# Patient Record
Sex: Female | Born: 1970 | Race: White | Hispanic: No | Marital: Married | Smoking: Never smoker
Health system: Southern US, Community
[De-identification: ages and names within clinical notes are randomized; demographics above are authoritative.]

## PROBLEM LIST (undated history)

## (undated) DIAGNOSIS — F32A Depression, unspecified: Secondary | ICD-10-CM

## (undated) DIAGNOSIS — I10 Essential (primary) hypertension: Secondary | ICD-10-CM

## (undated) DIAGNOSIS — F419 Anxiety disorder, unspecified: Secondary | ICD-10-CM

## (undated) DIAGNOSIS — R011 Cardiac murmur, unspecified: Secondary | ICD-10-CM

## (undated) DIAGNOSIS — R112 Nausea with vomiting, unspecified: Secondary | ICD-10-CM

## (undated) HISTORY — PX: EYE SURGERY: SHX253

## (undated) HISTORY — PX: BREAST SURGERY: SHX581

## (undated) HISTORY — PX: BACK SURGERY: SHX140

---

## 2021-02-10 ENCOUNTER — Emergency Department (HOSPITAL_COMMUNITY)
Admission: EM | Admit: 2021-02-10 | Discharge: 2021-02-10 | Disposition: A | Discharge status: Home / Self Care | Payer: BC Managed Care – PPO | Attending: Emergency Medicine | Admitting: Emergency Medicine

## 2021-02-10 ENCOUNTER — Other Ambulatory Visit: Payer: Self-pay

## 2021-02-10 ENCOUNTER — Encounter (HOSPITAL_COMMUNITY): Payer: Self-pay

## 2021-02-10 DIAGNOSIS — R112 Nausea with vomiting, unspecified: Secondary | ICD-10-CM | POA: Diagnosis not present

## 2021-02-10 DIAGNOSIS — I10 Essential (primary) hypertension: Secondary | ICD-10-CM | POA: Insufficient documentation

## 2021-02-10 HISTORY — DX: Essential (primary) hypertension: I10

## 2021-02-10 LAB — CBC WITH DIFFERENTIAL/PLATELET
Abs Immature Granulocytes: 0.02 10*3/uL (ref 0.00–0.07)
Basophils Absolute: 0.1 10*3/uL (ref 0.0–0.1)
Basophils Relative: 1 %
Eosinophils Absolute: 0.1 10*3/uL (ref 0.0–0.5)
Eosinophils Relative: 1 %
HCT: 41.1 % (ref 36.0–46.0)
Hemoglobin: 13 g/dL (ref 12.0–15.0)
Immature Granulocytes: 0 %
Lymphocytes Relative: 22 %
Lymphs Abs: 1.7 10*3/uL (ref 0.7–4.0)
MCH: 32 pg (ref 26.0–34.0)
MCHC: 31.6 g/dL (ref 30.0–36.0)
MCV: 101.2 fL — ABNORMAL HIGH (ref 80.0–100.0)
Monocytes Absolute: 0.6 10*3/uL (ref 0.1–1.0)
Monocytes Relative: 7 %
Neutro Abs: 5.4 10*3/uL (ref 1.7–7.7)
Neutrophils Relative %: 69 %
Platelets: 200 10*3/uL (ref 150–400)
RBC: 4.06 MIL/uL (ref 3.87–5.11)
RDW: 13 % (ref 11.5–15.5)
WBC: 7.9 10*3/uL (ref 4.0–10.5)
nRBC: 0 % (ref 0.0–0.2)

## 2021-02-10 LAB — COMPREHENSIVE METABOLIC PANEL
ALT: 21 U/L (ref 0–44)
AST: 23 U/L (ref 15–41)
Albumin: 4.4 g/dL (ref 3.5–5.0)
Alkaline Phosphatase: 34 U/L — ABNORMAL LOW (ref 38–126)
Anion gap: 10 (ref 5–15)
BUN: 12 mg/dL (ref 6–20)
CO2: 18 mmol/L — ABNORMAL LOW (ref 22–32)
Calcium: 9.3 mg/dL (ref 8.9–10.3)
Chloride: 109 mmol/L (ref 98–111)
Creatinine, Ser: 0.65 mg/dL (ref 0.44–1.00)
GFR, Estimated: >60 mL/min (ref >60)
Glucose, Bld: 110 mg/dL — ABNORMAL HIGH (ref 70–99)
Potassium: 4.2 mmol/L (ref 3.5–5.1)
Sodium: 137 mmol/L (ref 135–145)
Total Bilirubin: 0.7 mg/dL (ref 0.3–1.2)
Total Protein: 7.1 g/dL (ref 6.5–8.1)

## 2021-02-10 LAB — LIPASE, BLOOD: Lipase: 55 U/L — ABNORMAL HIGH (ref 11–51)

## 2021-02-10 MED ORDER — ONDANSETRON HCL 4 MG/2ML IJ SOLN
4.0000 mg | Freq: Once | INTRAMUSCULAR | Status: AC
  Administered 2021-02-10: 4 mg via INTRAVENOUS
  Filled 2021-02-10: qty 2

## 2021-02-10 MED ORDER — SODIUM CHLORIDE 0.9 % IV SOLN: INTRAVENOUS | Status: DC

## 2021-02-10 MED ORDER — SODIUM CHLORIDE 0.9 % IV BOLUS
1000.0000 mL | Freq: Once | INTRAVENOUS | Status: AC
  Administered 2021-02-10: 1000 mL via INTRAVENOUS

## 2021-02-10 MED ORDER — PROMETHAZINE HCL 25 MG PO TABS: 25.0000 mg | ORAL_TABLET | Freq: Four times a day (QID) | ORAL | 0 refills | Status: DC | PRN

## 2021-02-10 MED ORDER — DIPHENHYDRAMINE HCL 50 MG/ML IJ SOLN
25.0000 mg | Freq: Once | INTRAMUSCULAR | Status: AC
  Administered 2021-02-10: 25 mg via INTRAVENOUS
  Filled 2021-02-10: qty 1

## 2021-02-10 NOTE — ED Notes (Signed)
Pt resting has not vomited since being in ER.

## 2021-02-10 NOTE — ED Triage Notes (Signed)
Pt drove to work became nauseous and dizzy.  Began throwing up at work.  Pt has thrown up 2 times since she became sick at an hour ago. Skin warm dry, alert and oriented

## 2021-02-10 NOTE — Discharge Instructions (Signed)
Work-up here today labs without any significant abnormalities.  Vital signs very normal.  Just so you know pregnancy test was not done.  Tums consistent with an acute gastritis most likely viral since the white blood cell count was normal in your blood.  Take the Phenergan as needed for nausea and vomiting.  Return for any new or worse symptoms.

## 2021-02-10 NOTE — ED Provider Notes (Signed)
[facility] EMERGENCY DEPARTMENT Provider Note   CSN: [id number] Arrival date & time: 02/10/21  7425     History Chief Complaint  Patient presents with  . Nausea    Bethany Lowery is a 50 y.o. female.  Patient brought in by EMS.  Patient was on her way to work when she started getting nauseous and dizzy.  When she got to work she threw up 2 times.  She started to get sick about an hour ago.  Denies any abdominal pain no blood in the vomit.  No sick exposures.  No diarrhea.  The dizziness and lightheadedness is now resolved.  Past medical history is significant for hypertension alopecia.        Past Medical History:  Diagnosis Date  . Hypertension     There are no problems to display for this patient.   History reviewed. No pertinent surgical history.   OB History   No obstetric history on file.     No family history on file.  Social History   Tobacco Use  . Smoking status: Never Smoker  . Smokeless tobacco: Never Used  Vaping Use  . Vaping Use: Never used  Substance Use Topics  . Alcohol use: Not Currently  . Drug use: Not Currently    Home Medications Prior to Admission medications   Medication Sig Start Date End Date Taking? Authorizing Provider  promethazine (PHENERGAN) 25 MG tablet Take 1 tablet (25 mg total) by mouth every 6 (six) hours as needed for nausea or vomiting. 02/10/21  Yes Vanetta Mulders, MD    Allergies    Patient has no allergy information on record.  Review of Systems   Review of Systems  Constitutional: Negative for chills and fever.  HENT: Negative for congestion, rhinorrhea and sore throat.   Eyes: Negative for visual disturbance.  Respiratory: Negative for cough and shortness of breath.   Cardiovascular: Negative for chest pain and leg swelling.  Gastrointestinal: Positive for diarrhea and nausea. Negative for abdominal pain and vomiting.  Genitourinary: Negative for dysuria.  Musculoskeletal: Negative for back pain and neck  pain.  Skin: Negative for rash.  Neurological: Positive for dizziness. Negative for light-headedness and headaches.  Hematological: Does not bruise/bleed easily.  Psychiatric/Behavioral: Negative for confusion.    Physical Exam Updated Vital Signs BP 132/80   Pulse 64   Temp (!) 97.1 F (36.2 C) (Oral)   Resp (!) 21   Ht 1.651 m (5\' 5" )   Wt 68 kg   SpO2 100%   BMI 24.96 kg/m   Physical Exam Vitals and nursing note reviewed.  Constitutional:      General: She is not in acute distress.    Appearance: Normal appearance. She is well-developed and well-nourished. She is not ill-appearing or toxic-appearing.  HENT:     Head: Normocephalic and atraumatic.  Eyes:     Extraocular Movements: Extraocular movements intact.     Conjunctiva/sclera: Conjunctivae normal.     Pupils: Pupils are equal, round, and reactive to light.  Cardiovascular:     Rate and Rhythm: Normal rate and regular rhythm.     Heart sounds: No murmur heard.   Pulmonary:     Effort: Pulmonary effort is normal. No respiratory distress.     Breath sounds: Normal breath sounds.  Abdominal:     Palpations: Abdomen is soft.     Tenderness: There is no abdominal tenderness.  Musculoskeletal:        General: No swelling or edema. Normal range  of motion.     Cervical back: Normal range of motion and neck supple.  Skin:    General: Skin is warm and dry.     Capillary Refill: Capillary refill takes less than 2 seconds.  Neurological:     General: No focal deficit present.     Mental Status: She is alert and oriented to person, place, and time.     Cranial Nerves: No cranial nerve deficit.     Sensory: No sensory deficit.     Motor: No weakness.     Coordination: Coordination normal.  Psychiatric:        Mood and Affect: Mood and affect normal.     ED Results / Procedures / Treatments   Labs (all labs ordered are listed, but only abnormal results are displayed) Labs Reviewed  COMPREHENSIVE METABOLIC  PANEL - Abnormal; Notable for the following components:      Result Value   CO2 18 (*)    Glucose, Bld 110 (*)    Alkaline Phosphatase 34 (*)    All other components within normal limits  CBC WITH DIFFERENTIAL/PLATELET - Abnormal; Notable for the following components:   MCV 101.2 (*)    All other components within normal limits  LIPASE, BLOOD - Abnormal; Notable for the following components:   Lipase 55 (*)    All other components within normal limits    EKG None  Radiology No results found.  Procedures Procedures   Medications Ordered in ED Medications  0.9 %  sodium chloride infusion ( Intravenous New Bag/Given 02/10/21 1024)  sodium chloride 0.9 % bolus 1,000 mL (0 mLs Intravenous Stopped 02/10/21 1024)  ondansetron (ZOFRAN) injection 4 mg (4 mg Intravenous Given 02/10/21 0843)  diphenhydrAMINE (BENADRYL) injection 25 mg (25 mg Intravenous Given 02/10/21 0093)    ED Course  I have reviewed the triage vital signs and the nursing notes.  Pertinent labs & imaging results that were available during my care of the patient were reviewed by me and considered in my medical decision making (see chart for details).    MDM Rules/Calculators/A&P                          Patient with acute onset of nausea vomiting 2 times.  Patient received IV fluids here received some Zofran.  Thought maybe she had little bit of allergic reaction developed a rash so she received some Benadryl.  That did not progress.  Is now resolved.  Patient now feeling well.  Received a liter of fluids.  Patient is now urinated 3 times.  So I think she is well hydrated.  No abdominal pain.  Labs did not include pregnancy test.  Lipase was just slightly elevated but this doesn't seem to be consistent with acute pancreatitis.  Her LFTs without significant abnormalities.  No significant leukocytosis.  White blood cell count is normal.  Hemoglobin is normal at 13.  Patient stable for discharge home.  Does not need a  work note.  We'll give her Phenergan if any nausea or vomiting reoccurs.  Patient will return for any new or worse symptoms.  Final Clinical Impression(s) / ED Diagnoses Final diagnoses:  Non-intractable vomiting with nausea, unspecified vomiting type    Rx / DC Orders ED Discharge Orders         Ordered    promethazine (PHENERGAN) 25 MG tablet  Every 6 hours PRN        02/10/21 1254  Vanetta Mulders, MD 02/10/21 1304

## 2021-02-18 ENCOUNTER — Telehealth: Payer: Self-pay

## 2021-02-18 NOTE — Telephone Encounter (Signed)
NOTES ON FILE FROM [facility] [phone], SENT REFERRAL TO SCHEDULING

## 2021-03-14 ENCOUNTER — Encounter (HOSPITAL_COMMUNITY): Payer: Self-pay | Admitting: Emergency Medicine

## 2021-03-14 ENCOUNTER — Emergency Department (HOSPITAL_COMMUNITY)
Admission: EM | Admit: 2021-03-14 | Discharge: 2021-03-14 | Disposition: A | Discharge status: Home / Self Care | Payer: BC Managed Care – PPO | Attending: Emergency Medicine | Admitting: Emergency Medicine

## 2021-03-14 ENCOUNTER — Other Ambulatory Visit: Payer: Self-pay

## 2021-03-14 DIAGNOSIS — M549 Dorsalgia, unspecified: Secondary | ICD-10-CM | POA: Diagnosis present

## 2021-03-14 DIAGNOSIS — Z5321 Procedure and treatment not carried out due to patient leaving prior to being seen by health care provider: Secondary | ICD-10-CM | POA: Diagnosis not present

## 2021-03-14 MED ORDER — OXYCODONE-ACETAMINOPHEN 5-325 MG PO TABS
1.0000 | ORAL_TABLET | Freq: Once | ORAL | Status: AC
Start: 2021-03-14 — End: 2021-03-14
  Administered 2021-03-14: 1 via ORAL
  Filled 2021-03-14: qty 1

## 2021-03-14 NOTE — ED Triage Notes (Signed)
Pt. Stated, Ive had back pain for 4 weeks with no injury

## 2021-03-14 NOTE — ED Notes (Signed)
Patient left on own accord °

## 2021-03-14 NOTE — ED Triage Notes (Signed)
Pt. Has been taken Ibuprofen and Tylenol and has not helped the pain.

## 2021-03-16 ENCOUNTER — Ambulatory Visit: Payer: BC Managed Care – PPO | Admitting: Internal Medicine

## 2021-03-16 ENCOUNTER — Other Ambulatory Visit: Payer: Self-pay

## 2021-03-16 ENCOUNTER — Encounter: Payer: Self-pay | Admitting: Internal Medicine

## 2021-03-16 VITALS — BP 112/74 | HR 73 | Ht 65.0 in | Wt 162.0 lb

## 2021-03-16 DIAGNOSIS — I10 Essential (primary) hypertension: Secondary | ICD-10-CM | POA: Diagnosis not present

## 2021-03-16 DIAGNOSIS — R55 Syncope and collapse: Secondary | ICD-10-CM | POA: Diagnosis not present

## 2021-03-16 DIAGNOSIS — R072 Precordial pain: Secondary | ICD-10-CM | POA: Diagnosis not present

## 2021-03-16 MED ORDER — METOPROLOL TARTRATE 50 MG PO TABS: 50.0000 mg | ORAL_TABLET | Freq: Once | ORAL | 0 refills | Status: AC

## 2021-03-16 NOTE — Patient Instructions (Addendum)
Medication Instructions:  PLEASE TAKE METOPROLOL TARTRATE 50mg  (1) TABLET 2 HOURS PRIOR TO CCTA  *If you need a refill on your cardiac medications before your next appointment, please call your pharmacy*  Lab Work: BMET- 1 WEEK PRIOR TO CCTA  If you have labs (blood work) drawn today and your tests are completely normal, you will receive your results only by: MyChart Message (if you have MyChart) OR . A paper copy in the mail If you have any lab test that is abnormal or we need to change your treatment, we will call you to review the results.  Testing/Procedures: Your physician has requested that you have a renal artery duplex. During this test, an ultrasound is used to evaluate blood flow to the kidneys. Allow one hour for this exam. Do not eat after midnight the day before and avoid carbonated beverages. Take your medications as you usually do.  Your physician has requested that you have cardiac CT. Cardiac computed tomography (CT) is a painless test that uses an x-ray machine to take clear, detailed pictures of your heart. For further information please visit [facility]. Please follow instruction sheet as given.  Follow-Up: At [facility], you and your health needs are our priority.  As part of our continuing mission to provide you with exceptional heart care, we have created designated Provider Care Teams.  These Care Teams include your primary Cardiologist (physician) and Advanced Practice Providers (APPs -  Physician Assistants and Nurse Practitioners) who all work together to provide you with the care you need, when you need it.  We recommend signing up for the patient portal called "MyChart".  Sign up information is provided on this After Visit Summary.  MyChart is used to connect with patients for Virtual Visits (Telemedicine).  Patients are able to view lab/test results, encounter notes, upcoming appointments, etc.  Non-urgent messages can be sent to your provider as well.    To learn more about what you can do with MyChart, go to [facility].    Your next appointment :   AS NEEDED  The format for your next appointment:   In Person  Provider:   [email or web address], MD  Other Instructions Your cardiac CT will be scheduled at one of the below locations:   [facility] [address] [place], [place] [phone]  If scheduled at [facility], please arrive at the [facility] main entrance (entrance A) of [facility] 30 minutes prior to test start time. Proceed to the [facility] Radiology Department (first floor) to check-in and test prep.  Please follow these instructions carefully (unless otherwise directed):  On the Night Before the Test: . Be sure to Drink plenty of water. . Do not consume any caffeinated/decaffeinated beverages or chocolate 12 hours prior to your test. . Do not take any antihistamines 12 hours prior to your test.  On the Day of the Test: . Drink plenty of water until 1 hour prior to the test. . Do not eat any food 4 hours prior to the test. . You may take your regular medications prior to the test.  . Take metoprolol (Lopressor)50mg  (1) TABLET two hours prior to test. . HOLD Furosemide/Hydrochlorothiazide morning of the test. . FEMALES- please wear underwire-free bra if available .  After the Test: . Drink plenty of water. . After receiving IV contrast, you may experience a mild flushed feeling. This is normal. . On occasion, you may experience a mild rash up to 24  hours after the test. This is not dangerous. If this occurs, you can take Benadryl 25 mg and increase your fluid intake. . If you experience trouble breathing, this can be serious. If it is severe call 911 IMMEDIATELY. If it is mild, please call our office. . If you take any of these medications: Glipizide/Metformin, Avandament, Glucavance, please do not take 48 hours after completing test unless otherwise  instructed.  Once we have confirmed authorization from your insurance company, we will call you to set up a date and time for your test. Based on how quickly your insurance processes prior authorizations requests, please allow up to 4 weeks to be contacted for scheduling your Cardiac CT appointment. Be advised that routine Cardiac CT appointments could be scheduled as many as 8 weeks after your provider has ordered it.  For non-scheduling related questions, please contact the cardiac imaging nurse navigator should you have any questions/concerns: Rockwell Alexandria, Cardiac Imaging Nurse Navigator Larey Brick, Cardiac Imaging Nurse Navigator [employer] [facility] Direct Office Dial: [phone]   For scheduling needs, including cancellations and rescheduling, please call [place], [phone].

## 2021-03-16 NOTE — Progress Notes (Signed)
Cardiology Office Note:    Date:  03/16/2021   ID:  Bethany Lowery, DOB 08-Feb-1971, MRN [id number]  PCP:  Patient, No Pcp Per (Inactive)  Cardiologist:  No primary care provider on file.  Electrophysiologist:  None   Referring MD: No ref. provider found   Chief Complaint/Reason for Referral: Presyncope  History of Present Illness:    Bethany Lowery is a 50 y.o. female with a history of migraines, endometriosis, alopecia, anxiety, depression, fibroids.  She presents today for further evaluation of a near syncopal episode that is most consistent with vasovagal presyncope.  She described for her primary doctor, Dr. Mayford Knife in [facility], that she had an episode of profuse sweating with general malaise, felt extremely hot and nauseated, and had significant abdominal cramps.  She had vomiting and diarrhea with lightheadedness and shakiness, and had bradycardia with a rate of 41 and pulse oximeter readings of 76% on room air.  Blood pressure was 150 systolic per report.  Blood sugar was 124.  She was given Zofran and Benadryl and was felt to be stable for discharge.   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  She has been told that she has a heart murmur, but had a very recent echocardiogram performed in the [facility] system which notes normal biventricular function, normal biatrial chamber size, no pericardial effusion, normal aortic root dimension, normal cardiac valves with trace physiologic regurgitation.  IVC not well-visualized.  She notes that when she gave birth to 3rd child had hypotensive episode that was significant and very similar to this current episode.  She also notes a sense of chest discomfort which can happen at rest or with activity, and is not correlated with particular foods or activities.  This only happens on occasion but she is concerned about her heart and wants to make sure that there is no  abnormality that may have contributed to her symptoms.  We discussed that ischemic heart disease is unlikely at her age and would be an unlikely cause of neurocardiogenic symptoms, however her chest pain is not otherwise explained.  Wall motion is described as normal on her echocardiogram, but I cannot independently reviewed these images.  Strong fhx of HTN.  GF COPD.    Past Medical History:  Diagnosis Date  . Hypertension     No past surgical history on file.  Current Medications: Current Meds  Medication Sig  . amLODipine (NORVASC) 10 MG tablet Take 10 mg by mouth daily. 1 Tablet Daily  . gabapentin (NEURONTIN) 300 MG capsule Take 300 mg by mouth at bedtime.  Marland Kitchen HYDROcodone-acetaminophen (HYCET) 7.5-325 mg/15 ml solution Take 10 mLs by mouth 4 (four) times daily as needed for moderate pain.  Marland Kitchen HYDROcodone-acetaminophen (NORCO/VICODIN) 5-325 MG tablet Take 1 tablet by mouth every 6 (six) hours as needed for moderate pain.  . metoprolol tartrate (LOPRESSOR) 50 MG tablet Take 1 tablet (50 mg total) by mouth once for 1 dose. PLEASE TAKE METOPROLOL 2  HOURS PRIOR TO CTA SCAN.  Marland Kitchen venlafaxine (EFFEXOR) 25 MG tablet Take 25 mg by mouth daily. Take 1/2 Half Tablet Daily     Allergies:   Penicillins, Tramadol, and Zofran [ondansetron]   Social History   Tobacco Use  . Smoking status: Never Smoker  . Smokeless tobacco: Never Used  Vaping Use  . Vaping Use: Never used  Substance Use Topics  . Alcohol use: Not Currently  . Drug use: Not Currently  Family History: The patient's family history is not on file.  ROS:   Please see the history of present illness.    All other systems reviewed and are negative.  EKGs/Labs/Other Studies Reviewed:    The following studies were reviewed today:  EKG: Normal sinus rhythm, anterolateral infarct pattern.  Recent Labs: 02/10/2021: ALT 21; BUN 12; Creatinine, Ser 0.65; Hemoglobin 13.0; Platelets 200; Potassium 4.2; Sodium 137  Recent  Lipid Panel No results found for: CHOL, TRIG, HDL, CHOLHDL, VLDL, LDLCALC, LDLDIRECT  Physical Exam:    VS:  BP 112/74   Pulse 73   Ht 5\' 5"  (1.651 m)   Wt 162 lb (73.5 kg)   LMP 03/08/2021   SpO2 94%   BMI 26.96 kg/m     Wt Readings from Last 5 Encounters:  03/16/21 162 lb (73.5 kg)  02/10/21 150 lb (68 kg)    Constitutional: No acute distress Eyes: sclera non-icteric, normal conjunctiva and lids ENMT:, moist mucous membranes Cardiovascular: regular rhythm, normal rate, soft systolic murmur. S1 and S2 normal. Radial pulses normal bilaterally. No jugular venous distention.  Respiratory: clear to auscultation bilaterally GI : normal bowel sounds, soft and nontender. No distention.   MSK: extremities warm, well perfused. No edema.  NEURO: grossly nonfocal exam, moves all extremities. PSYCH: alert and oriented x 3, normal mood and affect.   ASSESSMENT:    1. Hypertension, unspecified type   2. Precordial pain   3. Vasovagal episode    PLAN:    Hypertension, unspecified type - Plan: EKG 12-Lead, Basic metabolic panel, VAS 02/12/21 RENAL ARTERY DUPLEX -She has noted increasing blood pressures requiring increased dose of amlodipine more recently.  We discussed a complete work-up for secondary causes of hypertension.  She is already had an echocardiogram and EKG, we will obtain renal vascular studies to evaluate for renal artery stenosis.  For now she can continue amlodipine at current dose, blood pressure is well controlled today.  Precordial pain - Plan: EKG 12-Lead, CT CORONARY MORPH W/CTA COR W/SCORE W/CA W/CM &/OR WO/CM, CT CORONARY FRACTIONAL FLOW RESERVE DATA PREP, CT CORONARY FRACTIONAL FLOW RESERVE FLUID ANALYSIS, Basic metabolic panel -She is concerned about episodes of chest discomfort, and would like a thorough evaluation.  We discussed the risks and benefits of imaging for ischemic evaluation, and have participated in shared decision making.  To exclude coronary artery disease  in the setting of an autoimmune condition (alopecia), we will obtain a coronary CTA.  Vasovagal episode - Plan: EKG 12-Lead, Basic metabolic panel, VAS US RENAL ARTERY DUPLEX -She likely had a neurocardiogenic episode of presyncope given the classic description.  I have encouraged her to wear compression stockings which she is doing, hydrate adequately, monitor for low blood sugar, and to stay alert to the prodrome of symptoms so that she may find a place to sit or lie down to avoid fainting and having a head or body injury.  She will do so.  [place], MD, [facility]  [facility]    Medication Adjustments/Labs and Tests Ordered: Current medicines are reviewed at length with the patient today.  Concerns regarding medicines are outlined above.   Orders Placed This Encounter  Procedures  . CT CORONARY MORPH W/CTA COR W/SCORE W/CA W/CM &/OR WO/CM  . CT CORONARY FRACTIONAL FLOW RESERVE DATA PREP  . CT CORONARY FRACTIONAL FLOW RESERVE FLUID ANALYSIS  . Basic metabolic panel  . EKG 12-Lead  . VAS [facility] RENAL ARTERY DUPLEX    Meds ordered this  encounter  Medications  . metoprolol tartrate (LOPRESSOR) 50 MG tablet    Sig: Take 1 tablet (50 mg total) by mouth once for 1 dose. PLEASE TAKE METOPROLOL 2  HOURS PRIOR TO CTA SCAN.    Dispense:  1 tablet    Refill:  0    Patient Instructions  Medication Instructions:  PLEASE TAKE METOPROLOL TARTRATE 50mg  (1) TABLET 2 HOURS PRIOR TO CCTA  *If you need a refill on your cardiac medications before your next appointment, please call your pharmacy*  Lab Work: BMET- 1 WEEK PRIOR TO CCTA  If you have labs (blood work) drawn today and your tests are completely normal, you will receive your results only by: MyChart Message (if you have MyChart) OR . A paper copy in the mail If you have any lab test that is abnormal or we need to change your treatment, we will call you to review the results.  Testing/Procedures: Your physician has  requested that you have a renal artery duplex. During this test, an ultrasound is used to evaluate blood flow to the kidneys. Allow one hour for this exam. Do not eat after midnight the day before and avoid carbonated beverages. Take your medications as you usually do.  Your physician has requested that you have cardiac CT. Cardiac computed tomography (CT) is a painless test that uses an x-ray machine to take clear, detailed pictures of your heart. For further information please visit Marland Kitchen. Please follow instruction sheet as given.  Follow-Up: At [facility], you and your health needs are our priority.  As part of our continuing mission to provide you with exceptional heart care, we have created designated Provider Care Teams.  These Care Teams include your primary Cardiologist (physician) and Advanced Practice Providers (APPs -  Physician Assistants and Nurse Practitioners) who all work together to provide you with the care you need, when you need it.  We recommend signing up for the patient portal called "MyChart".  Sign up information is provided on this After Visit Summary.  MyChart is used to connect with patients for Virtual Visits (Telemedicine).  Patients are able to view lab/test results, encounter notes, upcoming appointments, etc.  Non-urgent messages can be sent to your provider as well.   To learn more about what you can do with MyChart, go to [facility].    Your next appointment :   AS NEEDED  The format for your next appointment:   In Person  Provider:   [email or web address], MD  Other Instructions Your cardiac CT will be scheduled at one of the below locations:   [facility] [address] [place], [place] [phone]  If scheduled at [facility], please arrive at the [facility] main entrance (entrance A) of [facility] 30 minutes prior to test start time. Proceed to the [facility] Radiology Department  (first floor) to check-in and test prep.  Please follow these instructions carefully (unless otherwise directed):  On the Night Before the Test: . Be sure to Drink plenty of water. . Do not consume any caffeinated/decaffeinated beverages or chocolate 12 hours prior to your test. . Do not take any antihistamines 12 hours prior to your test.  On the Day of the Test: . Drink plenty of water until 1 hour prior to the test. . Do not eat any food 4 hours prior to the test. . You may take your regular medications prior to the test.  . Take metoprolol (Lopressor)50mg  (1) TABLET  two hours prior to test. . HOLD Furosemide/Hydrochlorothiazide morning of the test. . FEMALES- please wear underwire-free bra if available .  After the Test: . Drink plenty of water. . After receiving IV contrast, you may experience a mild flushed feeling. This is normal. . On occasion, you may experience a mild rash up to 24 hours after the test. This is not dangerous. If this occurs, you can take Benadryl 25 mg and increase your fluid intake. . If you experience trouble breathing, this can be serious. If it is severe call 911 IMMEDIATELY. If it is mild, please call our office. . If you take any of these medications: Glipizide/Metformin, Avandament, Glucavance, please do not take 48 hours after completing test unless otherwise instructed.  Once we have confirmed authorization from your insurance company, we will call you to set up a date and time for your test. Based on how quickly your insurance processes prior authorizations requests, please allow up to 4 weeks to be contacted for scheduling your Cardiac CT appointment. Be advised that routine Cardiac CT appointments could be scheduled as many as 8 weeks after your provider has ordered it.  For non-scheduling related questions, please contact the cardiac imaging nurse navigator should you have any questions/concerns: Rockwell AlexandriaSara Wallace, Cardiac Imaging Nurse Navigator Larey BrickMerle  [place], Cardiac Imaging Nurse Navigator [facility] Direct Office Dial: [phone]   For scheduling needs, including cancellations and rescheduling, please call [place], [phone].

## 2021-04-04 ENCOUNTER — Ambulatory Visit (HOSPITAL_COMMUNITY)
Admission: RE | Admit: 2021-04-04 | Discharge: 2021-04-04 | Disposition: A | Discharge status: Home / Self Care | Payer: BC Managed Care – PPO | Source: Ambulatory Visit | Attending: Cardiovascular Disease | Admitting: Cardiovascular Disease

## 2021-04-04 ENCOUNTER — Other Ambulatory Visit: Payer: Self-pay

## 2021-04-04 DIAGNOSIS — I10 Essential (primary) hypertension: Secondary | ICD-10-CM | POA: Insufficient documentation

## 2021-04-04 DIAGNOSIS — R55 Syncope and collapse: Secondary | ICD-10-CM | POA: Insufficient documentation

## 2021-05-02 ENCOUNTER — Other Ambulatory Visit: Payer: Self-pay | Admitting: Neurosurgery

## 2021-05-02 DIAGNOSIS — I671 Cerebral aneurysm, nonruptured: Secondary | ICD-10-CM

## 2021-05-03 ENCOUNTER — Ambulatory Visit (HOSPITAL_COMMUNITY): Payer: BC Managed Care – PPO

## 2021-05-04 ENCOUNTER — Telehealth: Payer: Self-pay

## 2021-05-04 ENCOUNTER — Telehealth (HOSPITAL_COMMUNITY): Payer: Self-pay | Admitting: Emergency Medicine

## 2021-05-04 NOTE — Telephone Encounter (Signed)
Attempted to call patient in regard to blood work Designer, jewellery) that is needed prior to CT scan tomorrow. Left message for patient to call back to office.

## 2021-05-04 NOTE — Telephone Encounter (Signed)
Spoke with CT- patient does not need labs at this time.

## 2021-05-04 NOTE — Telephone Encounter (Signed)
Reaching out to patient to offer assistance regarding upcoming cardiac imaging study; pt verbalizes understanding of appt date/time, parking situation and where to check in, pre-test NPO status and medications ordered, and verified current allergies; name and call back number provided for further questions should they arise Bahja Bence RN Navigator Cardiac Imaging [facility] [phone] office [phone] cell  50mg metoprolol 2 hr prior to scan Vinh Sachs  

## 2021-05-05 ENCOUNTER — Other Ambulatory Visit: Payer: Self-pay

## 2021-05-05 ENCOUNTER — Ambulatory Visit (HOSPITAL_COMMUNITY)
Admission: RE | Admit: 2021-05-05 | Discharge: 2021-05-05 | Disposition: A | Discharge status: Home / Self Care | Payer: BC Managed Care – PPO | Source: Ambulatory Visit | Attending: Internal Medicine | Admitting: Internal Medicine

## 2021-05-05 DIAGNOSIS — R072 Precordial pain: Secondary | ICD-10-CM | POA: Insufficient documentation

## 2021-05-05 MED ORDER — NITROGLYCERIN 0.4 MG SL SUBL: 0.8000 mg | SUBLINGUAL_TABLET | Freq: Once | SUBLINGUAL | Status: AC

## 2021-05-05 MED ORDER — NITROGLYCERIN 0.4 MG SL SUBL
SUBLINGUAL_TABLET | SUBLINGUAL | Status: AC
  Administered 2021-05-05: 0.8 mg via SUBLINGUAL
  Filled 2021-05-05: qty 2

## 2021-05-05 MED ORDER — IOHEXOL 350 MG/ML SOLN
95.0000 mL | Freq: Once | INTRAVENOUS | Status: AC | PRN
  Administered 2021-05-05: 95 mL via INTRAVENOUS

## 2021-05-24 ENCOUNTER — Ambulatory Visit (HOSPITAL_COMMUNITY)
Admission: RE | Admit: 2021-05-24 | Discharge: 2021-05-24 | Disposition: A | Discharge status: Home / Self Care | Payer: BC Managed Care – PPO | Source: Ambulatory Visit | Attending: Neurosurgery | Admitting: Neurosurgery

## 2021-05-24 ENCOUNTER — Other Ambulatory Visit: Payer: Self-pay

## 2021-05-24 ENCOUNTER — Other Ambulatory Visit: Payer: Self-pay | Admitting: Neurosurgery

## 2021-05-24 DIAGNOSIS — Z880 Allergy status to penicillin: Secondary | ICD-10-CM | POA: Diagnosis not present

## 2021-05-24 DIAGNOSIS — Z888 Allergy status to other drugs, medicaments and biological substances status: Secondary | ICD-10-CM | POA: Insufficient documentation

## 2021-05-24 DIAGNOSIS — I671 Cerebral aneurysm, nonruptured: Secondary | ICD-10-CM | POA: Insufficient documentation

## 2021-05-24 DIAGNOSIS — Z79899 Other long term (current) drug therapy: Secondary | ICD-10-CM | POA: Diagnosis not present

## 2021-05-24 DIAGNOSIS — Z885 Allergy status to narcotic agent status: Secondary | ICD-10-CM | POA: Diagnosis not present

## 2021-05-24 DIAGNOSIS — I10 Essential (primary) hypertension: Secondary | ICD-10-CM | POA: Insufficient documentation

## 2021-05-24 HISTORY — PX: IR ANGIO VERTEBRAL SEL VERTEBRAL BILAT MOD SED: IMG5369

## 2021-05-24 HISTORY — PX: IR ANGIO INTRA EXTRACRAN SEL INTERNAL CAROTID BILAT MOD SED: IMG5363

## 2021-05-24 LAB — CBC WITH DIFFERENTIAL/PLATELET
Abs Immature Granulocytes: 0.02 10*3/uL (ref 0.00–0.07)
Basophils Absolute: 0 10*3/uL (ref 0.0–0.1)
Basophils Relative: 0 %
Eosinophils Absolute: 0.2 10*3/uL (ref 0.0–0.5)
Eosinophils Relative: 3 %
HCT: 39.3 % (ref 36.0–46.0)
Hemoglobin: 13 g/dL (ref 12.0–15.0)
Immature Granulocytes: 0 %
Lymphocytes Relative: 41 %
Lymphs Abs: 2.2 10*3/uL (ref 0.7–4.0)
MCH: 32.3 pg (ref 26.0–34.0)
MCHC: 33.1 g/dL (ref 30.0–36.0)
MCV: 97.5 fL (ref 80.0–100.0)
Monocytes Absolute: 0.5 10*3/uL (ref 0.1–1.0)
Monocytes Relative: 9 %
Neutro Abs: 2.5 10*3/uL (ref 1.7–7.7)
Neutrophils Relative %: 47 %
Platelets: 243 10*3/uL (ref 150–400)
RBC: 4.03 MIL/uL (ref 3.87–5.11)
RDW: 13.1 % (ref 11.5–15.5)
WBC: 5.5 10*3/uL (ref 4.0–10.5)
nRBC: 0 % (ref 0.0–0.2)

## 2021-05-24 LAB — BASIC METABOLIC PANEL
Anion gap: 7 (ref 5–15)
BUN: 11 mg/dL (ref 6–20)
CO2: 25 mmol/L (ref 22–32)
Calcium: 9.4 mg/dL (ref 8.9–10.3)
Chloride: 106 mmol/L (ref 98–111)
Creatinine, Ser: 0.75 mg/dL (ref 0.44–1.00)
GFR, Estimated: >60 mL/min (ref >60)
Glucose, Bld: 98 mg/dL (ref 70–99)
Potassium: 3.8 mmol/L (ref 3.5–5.1)
Sodium: 138 mmol/L (ref 135–145)

## 2021-05-24 LAB — PROTIME-INR
INR: 1 (ref 0.8–1.2)
Prothrombin Time: 13 seconds (ref 11.4–15.2)

## 2021-05-24 MED ORDER — LIDOCAINE HCL (PF) 1 % IJ SOLN
INTRAMUSCULAR | Status: AC
  Filled 2021-05-24: qty 30

## 2021-05-24 MED ORDER — FENTANYL CITRATE (PF) 100 MCG/2ML IJ SOLN
INTRAMUSCULAR | Status: AC
  Filled 2021-05-24: qty 2

## 2021-05-24 MED ORDER — IOHEXOL 300 MG/ML  SOLN
50.0000 mL | Freq: Once | INTRAMUSCULAR | Status: AC | PRN
  Administered 2021-05-24: 40 mL via INTRA_ARTERIAL

## 2021-05-24 MED ORDER — FENTANYL CITRATE (PF) 100 MCG/2ML IJ SOLN
INTRAMUSCULAR | Status: AC | PRN
  Administered 2021-05-24: 25 ug via INTRAVENOUS

## 2021-05-24 MED ORDER — LIDOCAINE HCL 1 % IJ SOLN
INTRAMUSCULAR | Status: AC | PRN
  Administered 2021-05-24: 10 mL

## 2021-05-24 MED ORDER — HEPARIN SODIUM (PORCINE) 1000 UNIT/ML IJ SOLN
INTRAMUSCULAR | Status: AC | PRN
  Administered 2021-05-24: 1000 [IU] via INTRAVENOUS

## 2021-05-24 MED ORDER — IOHEXOL 300 MG/ML  SOLN
50.0000 mL | Freq: Once | INTRAMUSCULAR | Status: AC | PRN
  Administered 2021-05-24: 10 mL via INTRA_ARTERIAL

## 2021-05-24 MED ORDER — HYDROCODONE-ACETAMINOPHEN 5-325 MG PO TABS: 1.0000 | ORAL_TABLET | ORAL | Status: DC | PRN

## 2021-05-24 MED ORDER — SODIUM CHLORIDE 0.9 % IV SOLN: INTRAVENOUS | Status: DC

## 2021-05-24 MED ORDER — IOHEXOL 300 MG/ML  SOLN
100.0000 mL | Freq: Once | INTRAMUSCULAR | Status: AC | PRN
  Administered 2021-05-24: 25 mL via INTRA_ARTERIAL

## 2021-05-24 MED ORDER — MIDAZOLAM HCL 2 MG/2ML IJ SOLN
INTRAMUSCULAR | Status: AC
  Filled 2021-05-24: qty 2

## 2021-05-24 MED ORDER — HEPARIN SODIUM (PORCINE) 1000 UNIT/ML IJ SOLN
INTRAMUSCULAR | Status: AC
  Filled 2021-05-24: qty 1

## 2021-05-24 MED ORDER — MIDAZOLAM HCL 2 MG/2ML IJ SOLN
INTRAMUSCULAR | Status: AC | PRN
  Administered 2021-05-24: 1 mg via INTRAVENOUS

## 2021-05-24 NOTE — Discharge Instructions (Addendum)
Femoral Site Care  This sheet gives you information about how to care for yourself after your procedure. Your health care provider may also give you more specific instructions. If you have problems or questions, contact your health care provider. What can I expect after the procedure? After the procedure, it is common to have:  Bruising that usually fades within 1-2 weeks.  Tenderness at the site. Follow these instructions at home: Wound care  Follow instructions from your health care provider about how to take care of your insertion site. Make sure you: ? Wash your hands with soap and water before you change your bandage (dressing). If soap and water are not available, use hand sanitizer. ? Change your dressing as told by your health care provider. ? Leave stitches (sutures), skin glue, or adhesive strips in place. These skin closures may need to stay in place for 2 weeks or longer. If adhesive strip edges start to loosen and curl up, you may trim the loose edges. Do not remove adhesive strips completely unless your health care provider tells you to do that.  Do not take baths, swim, or use a hot tub until your health care provider approves.  You may shower 24-48 hours after the procedure or as told by your health care provider. ? Gently wash the site with plain soap and water. ? Pat the area dry with a clean towel. ? Do not rub the site. This may cause bleeding.  Do not apply powder or lotion to the site. Keep the site clean and dry.  Check your femoral site every day for signs of infection. Check for: ? Redness, swelling, or pain. ? Fluid or blood. ? Warmth. ? Pus or a bad smell. Activity  For the first 2-3 days after your procedure, or as long as directed: ? Avoid climbing stairs as much as possible. ? Do not squat.  Do not lift anything that is heavier than 10 lb (4.5 kg), or the limit that you are told, until your health care provider says that it is safe.  Rest as  directed. ? Avoid sitting for a long time without moving. Get up to take short walks every 1-2 hours.  Do not drive for 24 hours if you were given a medicine to help you relax (sedative). General instructions  Take over-the-counter and prescription medicines only as told by your health care provider.  Keep all follow-up visits as told by your health care provider. This is important. Contact a health care provider if you have:  A fever or chills.  You have redness, swelling, or pain around your insertion site. Get help right away if:  The catheter insertion area swells very fast.  You pass out.  You suddenly start to sweat or your skin gets clammy.  The catheter insertion area is bleeding, and the bleeding does not stop when you hold steady pressure on the area.  The area near or just beyond the catheter insertion site becomes pale, cool, tingly, or numb. These symptoms may represent a serious problem that is an emergency. Do not wait to see if the symptoms will go away. Get medical help right away. Call your local emergency services (911 in the [place] Do not drive yourself to the hospital. Summary  After the procedure, it is common to have bruising that usually fades within 1-2 weeks.  Check your femoral site every day for signs of infection.  Do not lift anything that is heavier than 10 lb (4.5 kg), or   the limit that you are told, until your health care provider says that it is safe. This information is not intended to replace advice given to you by your health care provider. Make sure you discuss any questions you have with your health care provider. Document Revised: 08/06/2020 Document Reviewed: 08/06/2020 Elsevier Patient Education  2021 Elsevier Inc.  Cerebral Angiogram  A cerebral angiogram is a procedure that is used to examine the blood vessels in the brain and neck. Contrast dye is injected through a thin tube (catheter) into an artery. X-ray pictures are then taken.  The pictures can show an abnormality in a blood vessel, such as a blockage, narrowing (stenosis), or bulging (aneurysm). Tell a health care provider about:  Any allergies you have, including allergies to medicines, shellfish, contrast dye, or iodine.  All medicines you are taking, including vitamins, herbs, eye drops, creams, and over-the-counter medicines.  Any blood thinning medicines you take, such as aspirin or warfarin.  Any problems you or family members have had with anesthetic medicines.  Any blood disorders you have.  Any surgeries you have had.  Any medical conditions you have or have had, including kidney problems or kidney failure.  Whether you are pregnant or may be pregnant, or are breastfeeding. What are the risks? Generally, this is a safe procedure. However, problems may occur, including:  Problems in the insertion site, such as bleeding, bruising, infection, or collection of blood under the skin (hematoma).  Allergic reaction to medicines or dyes.  Damage to nearby structures or organs, including blood vessels or arteries. Also, contrast dye can damage the kidneys.  Blood clot.  Weakness, numbness, speech, or vision problems. This is usually temporary.  Stroke.  Amnesia, or being unable to remember what happened (rare). This is temporary. What happens before the procedure? Staying hydrated Follow instructions from your healthcare provider about hydration, which may include:  Up to 2 hours before the procedure - you may continue to drink clear liquids, such as water, clear fruit juice, black coffee, and plain tea. Eating and drinking restrictions Follow instructions from your health care provider about eating and drinking, which may include:  8 hours before the procedure - stop eating heavy meals or foods, such as meat, fried foods, or fatty foods.  6 hours before the procedure - stop eating light meals or foods, such as toast or cereal.  6 hours before  the procedure - stop drinking milk or drinks that contain milk.  2 hours before the procedure - stop drinking clear liquids. Medicines Ask your health care provider about:  Changing or stopping your regular medicines. This is especially important if you are taking diabetes medicines or blood thinners.  Taking medicines such as aspirin and ibuprofen. These medicines can thin your blood. Do not take these medicines unless your health care provider tells you to take them.  Taking over-the-counter medicines, vitamins, herbs, and supplements. General instructions  Do not use any products that contain nicotine or tobacco for at least 4 weeks before the procedure. These products include cigarettes, e-cigarettes, and chewing tobacco. If you need help quitting, ask your health care provider.  You may have blood tests done.  Plan to have someone take you home from the hospital or clinic.  If you will be going home the same day of the procedure, plan to have someone with you for 24 hours.  Ask your health care provider what steps will be taken to prevent infection. These may include: ? Removing hair at  the insertion site. ? Washing skin with a germ-killing soap. ? Taking antibiotic medicine. What happens during the procedure?  You will lie on an X-ray table. Your head and legs may be strapped down.  An IV will be inserted into one of your veins.  You will be given one or both of the following: ? A medicine to help you relax (sedative). ? A medicine to numb the area (local anesthetic) where the catheter will be inserted, usually in your groin, leg, or arm.  Your heart rate and other vital signs will be watched carefully. Electrodes may be placed on your chest.  A small incision will be made. The catheter will be moved through the incision up to the blood vessels in your neck and brain.  Dye will be injected into the catheter and will travel to the blood vessels of the brain and neck. You  may notice a warm feeling or strange taste in your mouth.  You will be asked to lie still. X-rays will be taken to show the flow of the dye through the blood vessels in the brain and neck.  If an abnormality is found in a blood vessel, another procedure may be done to treat the problem.  Tell your health care provider if you develop chest pain or trouble breathing during the procedure.  When the images are finished, the catheter will be removed. Pressure will be applied to stop bleeding.  A closure device may be placed into the access site to form a seal. The seal stops bleeding and helps the artery heal.  A bandage (dressing)will be applied over the small opening in the skin.  Your IV will be removed. The procedure may vary among health care providers and hospitals. What happens after the procedure?  Your blood pressure, heart rate, breathing rate, and blood oxygen level will be monitored until you leave the hospital or clinic.  You will be asked to lie flat for several hours. You will keep the limb where the catheter was inserted straight.  The insertion site and the pulse in your foot or wrist will be checked often.  You will be told to drink plenty of fluids. This will help flush the contrast dye out of your system.  Do not drive for 24 hours if you were given a sedative during your procedure.  It is up to you to get the results of your procedure. Ask your health care provider, or the department that is doing the procedure, when your results will be ready. Summary  A cerebral angiogram is a procedure that checks the health of the blood vessels in the brain and neck.  You will be given a sedativeand a local anesthetic. You may feel pressure when the catheter is inserted and warmth when the dye is injected.  Contrast dye is injected through a catheter into an artery. X-rays are taken to look for an abnormality, such as blockage or narrowing.  After the procedure, you will be  asked to lie flat for several hours. Do not drive for 24 hours, or until a health care provider tells you to. This information is not intended to replace advice given to you by your health care provider. Make sure you discuss any questions you have with your health care provider. Document Revised: 06/24/2019 Document Reviewed: 06/24/2019 Elsevier Patient Education  2021 Elsevier Inc. Moderate Conscious Sedation, Adult Sedation is the use of medicines to promote relaxation and to relieve discomfort and anxiety. Moderate conscious sedation is a  type of sedation. Under moderate conscious sedation, you are less alert than normal, but you are still able to respond to instructions, touch, or both. Moderate conscious sedation is used during short medical and dental procedures. It is milder than deep sedation, which is a type of sedation under which you cannot be easily woken up. It is also milder than general anesthesia, which is the use of medicines to make you unconscious. Moderate conscious sedation allows you to return to your regular activities sooner. Tell a health care provider about:  Any allergies you have.  All medicines you are taking, including vitamins, herbs, eye drops, creams, and over-the-counter medicines.  Any use of steroids. This includes steroids taken by mouth or as a cream.  Any problems you or family members have had with sedatives and anesthetic medicines.  Any blood disorders you have.  Any surgeries you have had.  Any medical conditions you have, such as sleep apnea.  Whether you are pregnant or may be pregnant.  Any use of cigarettes, alcohol, marijuana, or drugs. What are the risks? Generally, this is a safe procedure. However, problems may occur, including:  Getting too much medicine (oversedation).  Nausea.  Allergic reaction to medicines.  Trouble breathing. If this happens, a breathing tube may be used. It will be removed when you are awake and breathing  on your own.  Heart trouble.  Lung trouble.  Confusion that gets better with time (emergence delirium). What happens before the procedure? Staying hydrated Follow instructions from your health care provider about hydration, which may include:  Up to 2 hours before the procedure - you may continue to drink clear liquids, such as water, clear fruit juice, black coffee, and plain tea. Eating and drinking restrictions Follow instructions from your health care provider about eating and drinking, which may include:  8 hours before the procedure - stop eating heavy meals or foods, such as meat, fried foods, or fatty foods.  6 hours before the procedure - stop eating light meals or foods, such as toast or cereal.  6 hours before the procedure - stop drinking milk or drinks that contain milk.  2 hours before the procedure - stop drinking clear liquids. Medicines Ask your health care provider about:  Changing or stopping your regular medicines. This is especially important if you are taking diabetes medicines or blood thinners.  Taking medicines such as aspirin and ibuprofen. These medicines can thin your blood. Do not take these medicines unless your health care provider tells you to take them.  Taking over-the-counter medicines, vitamins, herbs, and supplements. Tests and exams  You will have a physical exam.  You may have blood tests done to show how well: ? Your kidneys and liver work. ? Your blood clots. General instructions  Plan to have a responsible adult take you home from the hospital or clinic.  If you will be going home right after the procedure, plan to have a responsible adult care for you for the time you are told. This is important. What happens during the procedure?  You will be given the sedative. The sedative may be given: ? As a pill that you will swallow. It can also be inserted into the rectum. ? As a spray through the nose. ? As an injection into the  muscle. ? As an injection into the vein through an IV.  You may be given oxygen as needed.  Your breathing, heart rate, and blood pressure will be monitored during the procedure.  The medical or dental procedure will be done. The procedure may vary among health care providers and hospitals.   What happens after the procedure?  Your blood pressure, heart rate, breathing rate, and blood oxygen level will be monitored until you leave the hospital or clinic.  You will get fluids through your IV if needed.  Do not drive or operate machinery until your health care provider says that it is safe. Summary  Sedation is the use of medicines to promote relaxation and to relieve discomfort and anxiety. Moderate conscious sedation is a type of sedation that is used during short medical and dental procedures.  Tell the health care provider about any medical conditions that you have and about all the medicines that you are taking.  You will be given the sedative as a pill, a spray through the nose, an injection into the muscle, or an injection into the vein through an IV. Vital signs are monitored during the sedation.  Moderate conscious sedation allows you to return to your regular activities sooner. This information is not intended to replace advice given to you by your health care provider. Make sure you discuss any questions you have with your health care provider. Document Revised: 04/02/2020 Document Reviewed: 10/30/2019 Elsevier Patient Education  2021 [employer].

## 2021-05-24 NOTE — H&P (Signed)
  Chief Complaint   Aneurysm  History of Present Illness  Bethany Lowery is a 50 y.o. female with history of difficult to control hypertension and vasovagal episodes.  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  Patient was seen in the outpatient neurosurgery clinic and elected to proceed with further work-up with diagnostic cerebral angiogram.  Past Medical History   Past Medical History:  Diagnosis Date  . Hypertension     Past Surgical History  No past surgical history on file.  Social History   Social History   Tobacco Use  . Smoking status: Never Smoker  . Smokeless tobacco: Never Used  Vaping Use  . Vaping Use: Never used  Substance Use Topics  . Alcohol use: Not Currently  . Drug use: Not Currently    Medications   Prior to Admission medications   Medication Sig Start Date End Date Taking? Authorizing Provider  amLODipine (NORVASC) 10 MG tablet Take 10 mg by mouth daily. 1 Tablet Daily   Yes [provider]  gabapentin (NEURONTIN) 300 MG capsule Take 300 mg by mouth at bedtime. 12/03/20  Yes [provider]  HYDROcodone-acetaminophen (HYCET) 7.5-325 mg/15 ml solution Take 10 mLs by mouth 4 (four) times daily as needed for moderate pain.   Yes [provider]  HYDROcodone-acetaminophen (NORCO/VICODIN) 5-325 MG tablet Take 1 tablet by mouth every 6 (six) hours as needed for moderate pain.   Yes [provider]  venlafaxine (EFFEXOR) 25 MG tablet Take 25 mg by mouth daily. Take 1/2 Half Tablet Daily   Yes [provider]  metoprolol tartrate (LOPRESSOR) 50 MG tablet Take 1 tablet (50 mg total) by mouth once for 1 dose. PLEASE TAKE METOPROLOL 2  HOURS PRIOR TO CTA SCAN. 03/16/21 03/16/21  Parke Poisson, MD    Allergies   Allergies  Allergen Reactions  . Penicillins Hives  . Tramadol     Other reaction(s): Hives  . Zofran [Ondansetron] Hives     Review of Systems  ROS  Neurologic Exam  Awake, alert, oriented Memory and concentration grossly intact Speech fluent, appropriate CN grossly intact Motor exam: Upper Extremities Deltoid Bicep Tricep Grip  Right 5/5 5/5 5/5 5/5  Left 5/5 5/5 5/5 5/5   Lower Extremities IP Quad PF DF EHL  Right 5/5 5/5 5/5 5/5 5/5  Left 5/5 5/5 5/5 5/5 5/5   Sensation grossly intact to LT  Imaging  MRI of the brain dated 03/19/2021 was reviewed and demonstrates a small aneurysm arising from the right A1 A2 junction.  There is triplicate A2.  Impression  - 50 y.o. 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  We have discussed the expected postoperative course and recovery.  All the patient's questions today were answered and she provided informed consent to proceed.  [name], MD [facility]

## 2021-05-24 NOTE — Brief Op Note (Signed)
  NEUROSURGERY BRIEF OPERATIVE  NOTE   PREOP DX: Cerebral aneurysm  POSTOP DX: Same  PROCEDURE: Diagnostic cerebral angiogram  SURGEON: Dr. [name], MD  ANESTHESIA: IV Sedation with Local  EBL: Minimal  SPECIMENS: None  COMPLICATIONS: None  CONDITION: Stable to recovery  FINDINGS (Full report in [facility]): 1. 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

## 2021-05-30 ENCOUNTER — Encounter (HOSPITAL_COMMUNITY): Payer: Self-pay

## 2021-06-06 ENCOUNTER — Other Ambulatory Visit: Payer: Self-pay | Admitting: Neurosurgery

## 2021-06-07 ENCOUNTER — Other Ambulatory Visit: Payer: Self-pay | Admitting: Neurosurgery

## 2021-06-24 NOTE — Progress Notes (Signed)
Surgical Instructions    Your procedure is scheduled on Thursday, July 14th, 2022.  Report to [facility] Main Entrance "A" at 10:30 A.M., then check in with the Admitting office.  Call this number if you have problems the morning of surgery:  [phone]   If you have any questions prior to your surgery date call [phone]: Open Monday-Friday 8am-4pm    Remember:  Do not eat or drink after midnight the night before your surgery     Take these medicines the morning of surgery with A SIP OF WATER:  amLODipine (NORVASC)  venlafaxine [facility])   If needed:  cetirizine (ZYRTEC) HYDROcodone-acetaminophen (NORCO/VICODIN)  As of today, STOP taking any Aspirin (unless otherwise instructed by your surgeon) Aleve, Naproxen, Ibuprofen, Motrin, Advil, Goody's, BC's, all herbal medications, fish oil, and all vitamins.          Do not wear jewelry or makeup Do not wear lotions, powders, perfumes, or deodorant. Do not shave 48 hours prior to surgery.   Do not bring valuables to the hospital. DO Not wear nail polish, gel polish, artificial nails, or any other type of covering on natural nails including finger and toenails. If patients have artificial nails, gel coating, etc. that need to be removed by a nail salon please have this removed prior to surgery or surgery may need to be canceled/delayed if the surgeon/ anesthesia feels like the patient is unable to be adequately monitored.             [facility] is not responsible for any belongings or valuables.  Do NOT Smoke (Tobacco/Vaping) or drink Alcohol 24 hours prior to your procedure If you use a CPAP at night, you may bring all equipment for your overnight stay.   Contacts, glasses, dentures or bridgework may not be worn into surgery, please bring cases for these belongings   For patients admitted to the hospital, discharge time will be determined by your treatment team.   Patients discharged the day of surgery will not be allowed  to drive home, and someone needs to stay with them for 24 hours.  ONLY 1 SUPPORT PERSON MAY BE PRESENT WHILE YOU ARE IN SURGERY. IF YOU ARE TO BE ADMITTED ONCE YOU ARE IN YOUR ROOM YOU WILL BE ALLOWED TWO (2) VISITORS.  Minor children may have two parents present. Special consideration for safety and communication needs will be reviewed on a case by case basis.  Special instructions:    Oral Hygiene is also important to reduce your risk of infection.  Remember - BRUSH YOUR TEETH THE MORNING OF SURGERY WITH YOUR REGULAR TOOTHPASTE   [facility]- Preparing For Surgery  Before surgery, you can play an important role. Because skin is not sterile, your skin needs to be as free of germs as possible. You can reduce the number of germs on your skin by washing with CHG (chlorahexidine gluconate) Soap before surgery.  CHG is an antiseptic cleaner which kills germs and bonds with the skin to continue killing germs even after washing.     Please do not use if you have an allergy to CHG or antibacterial soaps. If your skin becomes reddened/irritated stop using the CHG.  Do not shave (including legs and underarms) for at least 48 hours prior to first CHG shower. It is OK to shave your face.  Please follow these instructions carefully.     Shower the NIGHT BEFORE SURGERY and the MORNING OF SURGERY with CHG Soap.   If you chose  to wash your hair, wash your hair first as usual with your normal shampoo. After you shampoo, rinse your hair and body thoroughly to remove the shampoo.  Then [employer] and genitals (private parts) with your normal soap and rinse thoroughly to remove soap.  After that Use CHG Soap as you would any other liquid soap. You can apply CHG directly to the skin and wash gently with a scrungie or a clean washcloth.   Apply the CHG Soap to your body ONLY FROM THE NECK DOWN.  Do not use on open wounds or open sores. Avoid contact with your eyes, ears, mouth and genitals (private parts). Wash  Face and genitals (private parts)  with your normal soap.   Wash thoroughly, paying special attention to the area where your surgery will be performed.  Thoroughly rinse your body with warm water from the neck down.  DO NOT shower/wash with your normal soap after using and rinsing off the CHG Soap.  Pat yourself dry with a CLEAN TOWEL.  Wear CLEAN PAJAMAS to bed the night before surgery  Place CLEAN SHEETS on your bed the night before your surgery  DO NOT SLEEP WITH PETS.   Day of Surgery:  Take a shower with CHG soap. Wear Clean/Comfortable clothing the morning of surgery Do not apply any deodorants/lotions.   Remember to brush your teeth WITH YOUR REGULAR TOOTHPASTE.   Please read over the following fact sheets that you were given.

## 2021-06-27 ENCOUNTER — Encounter (HOSPITAL_COMMUNITY): Payer: Self-pay

## 2021-06-27 ENCOUNTER — Other Ambulatory Visit: Payer: Self-pay

## 2021-06-27 ENCOUNTER — Encounter (HOSPITAL_COMMUNITY)
Admission: RE | Admit: 2021-06-27 | Discharge: 2021-06-27 | Disposition: A | Discharge status: Home / Self Care | Payer: BC Managed Care – PPO | Source: Ambulatory Visit | Attending: Neurosurgery | Admitting: Neurosurgery

## 2021-06-27 DIAGNOSIS — Z20822 Contact with and (suspected) exposure to covid-19: Secondary | ICD-10-CM | POA: Insufficient documentation

## 2021-06-27 DIAGNOSIS — Z01812 Encounter for preprocedural laboratory examination: Secondary | ICD-10-CM | POA: Insufficient documentation

## 2021-06-27 HISTORY — DX: Other specified postprocedural states: R11.2

## 2021-06-27 HISTORY — DX: Anxiety disorder, unspecified: F41.9

## 2021-06-27 HISTORY — DX: Cardiac murmur, unspecified: R01.1

## 2021-06-27 HISTORY — DX: Depression, unspecified: F32.A

## 2021-06-27 LAB — URINALYSIS, ROUTINE W REFLEX MICROSCOPIC
Bilirubin Urine: NEGATIVE
Glucose, UA: NEGATIVE mg/dL
Hgb urine dipstick: NEGATIVE
Ketones, ur: NEGATIVE mg/dL
Leukocytes,Ua: NEGATIVE
Nitrite: NEGATIVE
Protein, ur: NEGATIVE mg/dL
Specific Gravity, Urine: 1.006 (ref 1.005–1.030)
pH: 5 (ref 5.0–8.0)

## 2021-06-27 LAB — BASIC METABOLIC PANEL
Anion gap: 8 (ref 5–15)
BUN: 7 mg/dL (ref 6–20)
CO2: 24 mmol/L (ref 22–32)
Calcium: 9.4 mg/dL (ref 8.9–10.3)
Chloride: 104 mmol/L (ref 98–111)
Creatinine, Ser: 0.63 mg/dL (ref 0.44–1.00)
GFR, Estimated: >60 mL/min (ref >60)
Glucose, Bld: 90 mg/dL (ref 70–99)
Potassium: 3.9 mmol/L (ref 3.5–5.1)
Sodium: 136 mmol/L (ref 135–145)

## 2021-06-27 LAB — CBC
HCT: 43.2 % (ref 36.0–46.0)
Hemoglobin: 14.1 g/dL (ref 12.0–15.0)
MCH: 32.9 pg (ref 26.0–34.0)
MCHC: 32.6 g/dL (ref 30.0–36.0)
MCV: 100.9 fL — ABNORMAL HIGH (ref 80.0–100.0)
Platelets: 251 10*3/uL (ref 150–400)
RBC: 4.28 MIL/uL (ref 3.87–5.11)
RDW: 13.6 % (ref 11.5–15.5)
WBC: 8.5 10*3/uL (ref 4.0–10.5)
nRBC: 0 % (ref 0.0–0.2)

## 2021-06-27 LAB — APTT: aPTT: 26 seconds (ref 24–36)

## 2021-06-27 NOTE — Progress Notes (Signed)
PCP - Melene Muller, MD [facility]) Cardiologist - Weston Brass, MD  PPM/ICD - denies Device Orders - N/A Rep Notified - N/A  Chest x-ray - N/A EKG - 03/16/2021 Stress Test - denies ECHO - 02/28/2021 Cardiac Cath - denies  Sleep Study - denies CPAP - N/A  Fasting Blood Sugar - N/A  Blood Thinner Instructions: N/A  Aspirin Instructions: Patient was instructed: As of today, STOP taking any Aspirin (unless otherwise instructed by your surgeon) Aleve, Naproxen, Ibuprofen, Motrin, Advil, Goody's, BC's, all herbal medications, fish oil, and all vitamins.  ERAS Protcol - no PRE-SURGERY Ensure or G2- N/A  COVID TEST- 06/27/2021   Anesthesia review: Yes; anesthesia complication - nausea/vomiting  Patient denies shortness of breath, fever, cough and chest pain at PAT appointment   All instructions explained to the patient, with a verbal understanding of the material. Patient agrees to go over the instructions while at home for a better understanding. Patient also instructed to self quarantine after being tested for COVID-19. The opportunity to ask questions was provided.

## 2021-06-28 LAB — SARS CORONAVIRUS 2 (TAT 6-24 HRS): SARS Coronavirus 2: NEGATIVE

## 2021-06-28 NOTE — Progress Notes (Signed)
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  She has aight A1 A2 junction aneurysm.  Other history includes never smoker, postoperative N/V, HTN, murmur (no significant valvular disease 02/28/21 echo), anxiety, depression, back surgery.   She had cardiology evaluation by Dr. Jacques Navy 03/16/2021 for presyncope.  Episode felt most consistent with vasovagal presyncope.  Echocardiogram in [facility] showed normal biventricular function, normal biatrial chamber size, no pericardial effusion, normal aortic root dimension, normal cardiac valves with trace physiologic regurgitation.  She also reported some precordial pain, so CCTA ordered which showed no CAD. As needed follow-up.   06/27/2021 presurgical COVID-19 test negative.  Anesthesia team to evaluate on the day of surgery.   VS: BP (!) 132/91   Pulse 70   Temp 36.6 C (Oral)   Resp 18   Ht 5\' 5"  (1.651 m)   Wt 68.3 kg   LMP 06/25/2021   SpO2 100%   BMI 25.04 kg/m   PROVIDERS: PCP - 08/26/2021, MD (in [place], [place]) Cardiologist - [place], MD. As needed follow-up per 03/16/21 visit.   LABS: Labs reviewed: Acceptable for surgery.  She is for urine pregnancy test on the day of surgery. (all labs ordered are listed, but only abnormal results are displayed)  Labs Reviewed  URINALYSIS, ROUTINE W REFLEX MICROSCOPIC - Abnormal; Notable for the following components:      Result Value   Color, Urine STRAW (*)    All other components within normal limits  CBC - Abnormal; Notable for the following components:   MCV 100.9 (*)    All other components within normal limits  SARS CORONAVIRUS 2 (TAT 6-24 HRS)   APTT  BASIC METABOLIC PANEL  TYPE AND SCREEN     IMAGES: Cerebral Angiogram 05/24/21: IMPRESSION:  1. 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   MRI Head and MRI Head 03/21/21 ([facility]): Impression: Normal MRI.  3 mm right side anterior communicating artery aneurysm on MRA.  MRI L-spine 03/21/21 ([facility]): IMPRESSION:  Severe facet arthropathy at L4-5. There is a synovial cyst projecting medially from the left facet joint with mass effect on the left L5 nerve root.    Thyroid 05/21/21 02/28/21 ([facility]): IMPRESSION:  Multiple solid nodules, TI-RADS category 4. The largest of these  measures 1.3 cm. Recommend follow-up per TI-RADS criteria as below.   TIRADS 1: no FNA required.  TIRADS 2: no FNA required.  TIRADS 3: Greater than or equal to 2.5 cm FNA, Greater than or equal to 1.5 cm follow up in 1, 3 and 5 years.  TIRADS 4: Greater than or equal to 1.5 cm FNA, Greater than or equal to 1.0 cm follow up in 1, 2, 3 and 5 years.  TIRADS 5: Greater than or equal to 1.0 cm FNA, Greater than or equal to 0.5 cm follow up annually for up to 5 years.     EKG: 03/16/2021: Normal sinus rhythm.  Anterolateral infarct, age undetermined.   CV: CT Coronary 05/05/21: IMPRESSION: 1. Coronary calcium score of 0. This was 0 percentile for age  and sex matched control. 2. Normal coronary origin with right dominance. 3. No evidence of CAD.    Renal artery [place] 04/04/21: Summary:  Renal:  Right: No evidence of right renal artery stenosis. RRV flow present.         Normal size right kidney. Normal right Resisitive Index.         Normal cortical thickness of right kidney.  Left:  No evidence of left renal artery stenosis. LRV flow present.         Normal size of left kidney. Normal left Resistive Index.         Normal cortical thickness of the left kidney.  Mesenteric:  Normal Celiac artery and  Superior Mesenteric artery findings.      Echo 02/28/21 ([facility]): Impression: 1. LV ejection fraction is estimated to be 55 to 60%.                           2. The right ventricle is normal in size.    -Trace mitral valve regurgitation.  Trivial tricuspid valve regurgitation.  Trace pulmonic valve regurgitation.                                                                                                               Past Medical History:  Diagnosis Date   Anxiety    Depression    Heart murmur    Hypertension    PONV (postoperative nausea and vomiting)     Past Surgical History:  Procedure Laterality Date   BACK SURGERY     BREAST SURGERY     EYE SURGERY     IR ANGIO INTRA EXTRACRAN SEL INTERNAL CAROTID BILAT MOD SED  05/24/2021   IR ANGIO VERTEBRAL SEL VERTEBRAL BILAT MOD SED  05/24/2021    MEDICATIONS:  amLODipine (NORVASC) 10 MG tablet   BIOTIN PO   cetirizine (ZYRTEC) 10 MG tablet   cholecalciferol (VITAMIN D3) 25 MCG (1000 UNIT) tablet   HYDROcodone-acetaminophen (NORCO/VICODIN) 5-325 MG tablet   metoprolol tartrate (LOPRESSOR) 50 MG tablet   Multiple Vitamin (MULTIVITAMIN WITH MINERALS) TABS tablet   Omega-3 Fatty Acids (FISH OIL PO)   venlafaxine (EFFEXOR) 25 MG tablet   vitamin E 180 MG (400 UNITS) capsule   No current facility-administered medications for this encounter.    Shonna Chock, PA-C Surgical Short Stay/Anesthesiology [facility] Phone [phone] [facility] Phone [phone] 06/28/2021 8:12 PM

## 2021-06-28 NOTE — Anesthesia Preprocedure Evaluation (Addendum)
Anesthesia Evaluation  Patient identified by MRN, date of birth, ID band Patient awake    Reviewed: Allergy & Precautions, NPO status , Patient's Chart, lab work & pertinent test results  History of Anesthesia Complications (+) PONV and history of anesthetic complications  Airway Mallampati: I  TM Distance: >3 FB Neck ROM: Full    Dental  (+) Teeth Intact, Dental Advisory Given   Pulmonary    breath sounds clear to auscultation       Cardiovascular hypertension, Pt. on medications  Rhythm:Regular Rate:Normal     Neuro/Psych PSYCHIATRIC DISORDERS Anxiety Depression    GI/Hepatic negative GI ROS, Neg liver ROS,   Endo/Other  negative endocrine ROS  Renal/GU negative Renal ROS     Musculoskeletal negative musculoskeletal ROS (+)   Abdominal Normal abdominal exam  (+)   Peds  Hematology negative hematology ROS (+)   Anesthesia Other Findings   Reproductive/Obstetrics                           Anesthesia Physical Anesthesia Plan  ASA: 3  Anesthesia Plan: General   Post-op Pain Management:    Induction: Intravenous  PONV Risk Score and Plan: Ondansetron, Dexamethasone, Midazolam and Treatment may vary due to age or medical condition  Airway Management Planned: Oral ETT  Additional Equipment: Arterial line, CVP and Ultrasound Guidance Line Placement  Intra-op Plan:   Post-operative Plan: Extubation in OR  Informed Consent: I have reviewed the patients History and Physical, chart, labs and discussed the procedure including the risks, benefits and alternatives for the proposed anesthesia with the patient or authorized representative who has indicated his/her understanding and acceptance.     Dental advisory given  Plan Discussed with: CRNA  Anesthesia Plan Comments: (PAT note written 06/28/2021 by Shonna Chock, PA-C. )      Anesthesia Quick Evaluation

## 2021-06-30 ENCOUNTER — Other Ambulatory Visit: Payer: Self-pay

## 2021-06-30 ENCOUNTER — Inpatient Hospital Stay (HOSPITAL_COMMUNITY): Payer: BC Managed Care – PPO | Admitting: Certified Registered"

## 2021-06-30 ENCOUNTER — Inpatient Hospital Stay (HOSPITAL_COMMUNITY): Payer: BC Managed Care – PPO | Admitting: Vascular Surgery

## 2021-06-30 ENCOUNTER — Inpatient Hospital Stay (HOSPITAL_COMMUNITY): Payer: BC Managed Care – PPO

## 2021-06-30 ENCOUNTER — Encounter (HOSPITAL_COMMUNITY): Payer: Self-pay | Admitting: Neurosurgery

## 2021-06-30 ENCOUNTER — Inpatient Hospital Stay (HOSPITAL_COMMUNITY)
Admission: RE | Admit: 2021-06-30 | Discharge: 2021-07-05 | DRG: 027 | Disposition: A | Discharge status: Home / Self Care | Payer: BC Managed Care – PPO | Attending: Neurosurgery | Admitting: Neurosurgery

## 2021-06-30 ENCOUNTER — Encounter (HOSPITAL_COMMUNITY)
Admission: RE | Disposition: A | Discharge status: Home / Self Care | Payer: Self-pay | Source: Home / Self Care | Attending: Neurosurgery

## 2021-06-30 DIAGNOSIS — Z20822 Contact with and (suspected) exposure to covid-19: Secondary | ICD-10-CM | POA: Diagnosis present

## 2021-06-30 DIAGNOSIS — F32A Depression, unspecified: Secondary | ICD-10-CM | POA: Diagnosis present

## 2021-06-30 DIAGNOSIS — Z880 Allergy status to penicillin: Secondary | ICD-10-CM | POA: Diagnosis not present

## 2021-06-30 DIAGNOSIS — Z452 Encounter for adjustment and management of vascular access device: Secondary | ICD-10-CM

## 2021-06-30 DIAGNOSIS — Z9109 Other allergy status, other than to drugs and biological substances: Secondary | ICD-10-CM | POA: Diagnosis not present

## 2021-06-30 DIAGNOSIS — Z8249 Family history of ischemic heart disease and other diseases of the circulatory system: Secondary | ICD-10-CM | POA: Diagnosis not present

## 2021-06-30 DIAGNOSIS — I671 Cerebral aneurysm, nonruptured: Secondary | ICD-10-CM | POA: Diagnosis present

## 2021-06-30 DIAGNOSIS — I10 Essential (primary) hypertension: Secondary | ICD-10-CM | POA: Diagnosis present

## 2021-06-30 DIAGNOSIS — Z79899 Other long term (current) drug therapy: Secondary | ICD-10-CM | POA: Diagnosis not present

## 2021-06-30 HISTORY — PX: CRANIOTOMY: SHX93

## 2021-06-30 LAB — POCT I-STAT 7, (LYTES, BLD GAS, ICA,H+H)
Acid-Base Excess: 1 mmol/L (ref 0.0–2.0)
Bicarbonate: 25.9 mmol/L (ref 20.0–28.0)
Calcium, Ion: 1.21 mmol/L (ref 1.15–1.40)
HCT: 32 % — ABNORMAL LOW (ref 36.0–46.0)
Hemoglobin: 10.9 g/dL — ABNORMAL LOW (ref 12.0–15.0)
O2 Saturation: 100 %
Patient temperature: 34.9
Potassium: 3.5 mmol/L (ref 3.5–5.1)
Sodium: 138 mmol/L (ref 135–145)
TCO2: 27 mmol/L (ref 22–32)
pCO2 arterial: 36.5 mmHg (ref 32.0–48.0)
pH, Arterial: 7.45 (ref 7.350–7.450)
pO2, Arterial: 403 mmHg — ABNORMAL HIGH (ref 83.0–108.0)

## 2021-06-30 LAB — MRSA NEXT GEN BY PCR, NASAL: MRSA by PCR Next Gen: NOT DETECTED

## 2021-06-30 LAB — PREPARE RBC (CROSSMATCH)

## 2021-06-30 LAB — ABO/RH: ABO/RH(D): B POS

## 2021-06-30 LAB — POCT PREGNANCY, URINE: Preg Test, Ur: NEGATIVE

## 2021-06-30 SURGERY — CRANIOTOMY INTRACRANIAL ANEURYSM FOR CAROTID
Anesthesia: General | Site: Head | Laterality: Right

## 2021-06-30 MED ORDER — SODIUM CHLORIDE 0.9 % IV SOLN: INTRAVENOUS | Status: DC | PRN

## 2021-06-30 MED ORDER — VITAMIN D 25 MCG (1000 UNIT) PO TABS
1000.0000 [IU] | ORAL_TABLET | Freq: Every day | ORAL | Status: DC
  Administered 2021-07-01 – 2021-07-05 (×5): 1000 [IU] via ORAL
  Filled 2021-06-30 (×5): qty 1

## 2021-06-30 MED ORDER — THROMBIN 5000 UNITS EX SOLR
CUTANEOUS | Status: AC
  Filled 2021-06-30: qty 5000

## 2021-06-30 MED ORDER — SODIUM CHLORIDE 0.9 % IV SOLN: INTRAVENOUS | Status: DC

## 2021-06-30 MED ORDER — BACITRACIN ZINC 500 UNIT/GM EX OINT
TOPICAL_OINTMENT | CUTANEOUS | Status: DC | PRN
  Administered 2021-06-30: 1 via TOPICAL

## 2021-06-30 MED ORDER — ROCURONIUM BROMIDE 10 MG/ML (PF) SYRINGE
PREFILLED_SYRINGE | INTRAVENOUS | Status: AC
  Filled 2021-06-30: qty 10

## 2021-06-30 MED ORDER — CHLORHEXIDINE GLUCONATE CLOTH 2 % EX PADS: 6.0000 | MEDICATED_PAD | Freq: Once | CUTANEOUS | Status: DC

## 2021-06-30 MED ORDER — ONDANSETRON HCL 4 MG/2ML IJ SOLN
4.0000 mg | INTRAMUSCULAR | Status: DC | PRN
  Administered 2021-07-02: 4 mg via INTRAVENOUS
  Filled 2021-06-30: qty 2

## 2021-06-30 MED ORDER — PROMETHAZINE HCL 25 MG/ML IJ SOLN
6.2500 mg | INTRAMUSCULAR | Status: DC | PRN
Start: 2021-06-30 — End: 2021-06-30

## 2021-06-30 MED ORDER — DEXAMETHASONE SODIUM PHOSPHATE 10 MG/ML IJ SOLN
INTRAMUSCULAR | Status: DC | PRN
  Administered 2021-06-30: 10 mg via INTRAVENOUS

## 2021-06-30 MED ORDER — ONDANSETRON HCL 4 MG/2ML IJ SOLN
INTRAMUSCULAR | Status: DC | PRN
  Administered 2021-06-30: 4 mg via INTRAVENOUS

## 2021-06-30 MED ORDER — ACETAMINOPHEN 325 MG PO TABS: 325.0000 mg | ORAL_TABLET | ORAL | Status: DC | PRN

## 2021-06-30 MED ORDER — FENTANYL CITRATE (PF) 100 MCG/2ML IJ SOLN
INTRAMUSCULAR | Status: AC
  Administered 2021-06-30: 50 ug via INTRAVENOUS
  Filled 2021-06-30: qty 2

## 2021-06-30 MED ORDER — LIDOCAINE 2% (20 MG/ML) 5 ML SYRINGE
INTRAMUSCULAR | Status: AC
  Filled 2021-06-30: qty 5

## 2021-06-30 MED ORDER — PROPOFOL 10 MG/ML IV BOLUS
INTRAVENOUS | Status: AC
  Filled 2021-06-30: qty 20

## 2021-06-30 MED ORDER — LACTATED RINGERS IV SOLN: INTRAVENOUS | Status: DC

## 2021-06-30 MED ORDER — VENLAFAXINE HCL 25 MG PO TABS
12.5000 mg | ORAL_TABLET | ORAL | Status: DC
  Administered 2021-07-01 – 2021-07-04 (×2): 12.5 mg via ORAL
  Filled 2021-06-30 (×2): qty 0.5

## 2021-06-30 MED ORDER — BUPIVACAINE HCL 0.5 % IJ SOLN
INTRAMUSCULAR | Status: DC | PRN
  Administered 2021-06-30: 3.5 mL

## 2021-06-30 MED ORDER — MIDAZOLAM HCL 2 MG/2ML IJ SOLN: 2.0000 mg | Freq: Once | INTRAMUSCULAR | Status: AC

## 2021-06-30 MED ORDER — LIDOCAINE 2% (20 MG/ML) 5 ML SYRINGE
INTRAMUSCULAR | Status: DC | PRN
  Administered 2021-06-30: 60 mg via INTRAVENOUS

## 2021-06-30 MED ORDER — OXYCODONE-ACETAMINOPHEN 5-325 MG PO TABS
1.0000 | ORAL_TABLET | ORAL | Status: DC | PRN
  Administered 2021-06-30 – 2021-07-05 (×7): 1 via ORAL
  Filled 2021-06-30 (×7): qty 1

## 2021-06-30 MED ORDER — OMEGA-3-ACID ETHYL ESTERS 1 G PO CAPS
1.0000 g | ORAL_CAPSULE | Freq: Every day | ORAL | Status: DC
  Administered 2021-07-01 – 2021-07-05 (×5): 1 g via ORAL
  Filled 2021-06-30 (×5): qty 1

## 2021-06-30 MED ORDER — FENTANYL CITRATE (PF) 100 MCG/2ML IJ SOLN: 50.0000 ug | Freq: Once | INTRAMUSCULAR | Status: AC

## 2021-06-30 MED ORDER — LIDOCAINE HCL (PF) 1 % IJ SOLN
INTRAMUSCULAR | Status: DC | PRN
  Administered 2021-06-30: 3.5 mL

## 2021-06-30 MED ORDER — LACTATED RINGERS IV SOLN: INTRAVENOUS | Status: DC | PRN

## 2021-06-30 MED ORDER — SENNOSIDES-DOCUSATE SODIUM 8.6-50 MG PO TABS: 1.0000 | ORAL_TABLET | Freq: Every evening | ORAL | Status: DC | PRN

## 2021-06-30 MED ORDER — VITAMIN E 45 MG (100 UNIT) PO CAPS
400.0000 [IU] | ORAL_CAPSULE | Freq: Every day | ORAL | Status: DC
  Administered 2021-07-01 – 2021-07-05 (×5): 400 [IU] via ORAL
  Filled 2021-06-30 (×6): qty 4

## 2021-06-30 MED ORDER — INDOCYANINE GREEN 25 MG IV SOLR
INTRAVENOUS | Status: DC | PRN
  Administered 2021-06-30: 12.5 mg via INTRAVENOUS

## 2021-06-30 MED ORDER — BACITRACIN ZINC 500 UNIT/GM EX OINT
TOPICAL_OINTMENT | CUTANEOUS | Status: AC
  Filled 2021-06-30: qty 28.35

## 2021-06-30 MED ORDER — FENTANYL CITRATE (PF) 250 MCG/5ML IJ SOLN
INTRAMUSCULAR | Status: AC
  Filled 2021-06-30: qty 5

## 2021-06-30 MED ORDER — PROPOFOL 10 MG/ML IV BOLUS
INTRAVENOUS | Status: DC | PRN
  Administered 2021-06-30: 50 mg via INTRAVENOUS
  Administered 2021-06-30: 120 mg via INTRAVENOUS

## 2021-06-30 MED ORDER — SUGAMMADEX SODIUM 200 MG/2ML IV SOLN
INTRAVENOUS | Status: DC | PRN
  Administered 2021-06-30: 200 mg via INTRAVENOUS

## 2021-06-30 MED ORDER — THROMBIN 20000 UNITS EX SOLR
CUTANEOUS | Status: AC
  Filled 2021-06-30: qty 20000

## 2021-06-30 MED ORDER — BIOTIN 2.5 MG PO TABS: ORAL_TABLET | Freq: Every day | ORAL | Status: DC

## 2021-06-30 MED ORDER — OXYCODONE HCL 5 MG/5ML PO SOLN: 5.0000 mg | Freq: Once | ORAL | Status: DC | PRN

## 2021-06-30 MED ORDER — FENTANYL CITRATE (PF) 100 MCG/2ML IJ SOLN
INTRAMUSCULAR | Status: DC | PRN
  Administered 2021-06-30: 100 ug via INTRAVENOUS
  Administered 2021-06-30: 50 ug via INTRAVENOUS
  Administered 2021-06-30: 100 ug via INTRAVENOUS

## 2021-06-30 MED ORDER — ACETAMINOPHEN 650 MG RE SUPP: 650.0000 mg | RECTAL | Status: DC | PRN

## 2021-06-30 MED ORDER — OXYCODONE HCL 5 MG PO TABS: 5.0000 mg | ORAL_TABLET | Freq: Once | ORAL | Status: DC | PRN

## 2021-06-30 MED ORDER — FENTANYL CITRATE (PF) 100 MCG/2ML IJ SOLN
25.0000 ug | INTRAMUSCULAR | Status: DC | PRN
  Administered 2021-06-30 (×2): 50 ug via INTRAVENOUS

## 2021-06-30 MED ORDER — 0.9 % SODIUM CHLORIDE (POUR BTL) OPTIME
TOPICAL | Status: DC | PRN
  Administered 2021-06-30: 3000 mL

## 2021-06-30 MED ORDER — THROMBIN 5000 UNITS EX SOLR: OROMUCOSAL | Status: DC | PRN

## 2021-06-30 MED ORDER — HYDROCODONE-ACETAMINOPHEN 5-325 MG PO TABS
1.0000 | ORAL_TABLET | ORAL | Status: DC | PRN
Start: 2021-06-30 — End: 2021-07-05
  Administered 2021-06-30 – 2021-07-05 (×10): 1 via ORAL
  Filled 2021-06-30 (×11): qty 1

## 2021-06-30 MED ORDER — ACETAMINOPHEN 325 MG PO TABS: 650.0000 mg | ORAL_TABLET | ORAL | Status: DC | PRN

## 2021-06-30 MED ORDER — LEVETIRACETAM IN NACL 1000 MG/100ML IV SOLN
1000.0000 mg | INTRAVENOUS | Status: AC
  Administered 2021-06-30: 1000 mg via INTRAVENOUS
  Filled 2021-06-30: qty 100

## 2021-06-30 MED ORDER — FENTANYL CITRATE (PF) 100 MCG/2ML IJ SOLN
INTRAMUSCULAR | Status: AC
  Filled 2021-06-30: qty 2

## 2021-06-30 MED ORDER — ACETAMINOPHEN 160 MG/5ML PO SOLN: 325.0000 mg | ORAL | Status: DC | PRN

## 2021-06-30 MED ORDER — THROMBIN 20000 UNITS EX SOLR: CUTANEOUS | Status: DC | PRN

## 2021-06-30 MED ORDER — ACETAMINOPHEN 10 MG/ML IV SOLN: 1000.0000 mg | Freq: Once | INTRAVENOUS | Status: DC | PRN

## 2021-06-30 MED ORDER — LIDOCAINE HCL (PF) 1 % IJ SOLN
INTRAMUSCULAR | Status: AC
  Filled 2021-06-30: qty 30

## 2021-06-30 MED ORDER — ONDANSETRON HCL 4 MG PO TABS: 4.0000 mg | ORAL_TABLET | ORAL | Status: DC | PRN

## 2021-06-30 MED ORDER — ROCURONIUM BROMIDE 10 MG/ML (PF) SYRINGE
PREFILLED_SYRINGE | INTRAVENOUS | Status: DC | PRN
  Administered 2021-06-30: 40 mg via INTRAVENOUS
  Administered 2021-06-30: 20 mg via INTRAVENOUS
  Administered 2021-06-30: 60 mg via INTRAVENOUS
  Administered 2021-06-30: 50 mg via INTRAVENOUS

## 2021-06-30 MED ORDER — ORAL CARE MOUTH RINSE: 15.0000 mL | Freq: Once | OROMUCOSAL | Status: AC

## 2021-06-30 MED ORDER — VANCOMYCIN HCL IN DEXTROSE 1-5 GM/200ML-% IV SOLN
1000.0000 mg | INTRAVENOUS | Status: AC
  Administered 2021-06-30: 1000 mg via INTRAVENOUS
  Filled 2021-06-30: qty 200

## 2021-06-30 MED ORDER — AMISULPRIDE (ANTIEMETIC) 5 MG/2ML IV SOLN
10.0000 mg | Freq: Once | INTRAVENOUS | Status: DC | PRN
Start: 2021-06-30 — End: 2021-06-30

## 2021-06-30 MED ORDER — SODIUM CHLORIDE 0.9 % IV SOLN
0.0125 ug/kg/min | INTRAVENOUS | Status: AC
  Administered 2021-06-30: .1 ug/kg/min via INTRAVENOUS
  Filled 2021-06-30: qty 2000

## 2021-06-30 MED ORDER — PHENYLEPHRINE HCL-NACL 10-0.9 MG/250ML-% IV SOLN
INTRAVENOUS | Status: DC | PRN
  Administered 2021-06-30: 25 ug/min via INTRAVENOUS

## 2021-06-30 MED ORDER — AMLODIPINE BESYLATE 10 MG PO TABS
10.0000 mg | ORAL_TABLET | Freq: Every day | ORAL | Status: DC
  Administered 2021-07-01 – 2021-07-05 (×5): 10 mg via ORAL
  Filled 2021-06-30 (×6): qty 1

## 2021-06-30 MED ORDER — ONDANSETRON HCL 4 MG/2ML IJ SOLN
INTRAMUSCULAR | Status: AC
  Filled 2021-06-30: qty 2

## 2021-06-30 MED ORDER — LORATADINE 10 MG PO TABS
10.0000 mg | ORAL_TABLET | Freq: Every day | ORAL | Status: DC
  Administered 2021-07-01 – 2021-07-05 (×5): 10 mg via ORAL
  Filled 2021-06-30 (×5): qty 1

## 2021-06-30 MED ORDER — MIDAZOLAM HCL 2 MG/2ML IJ SOLN
INTRAMUSCULAR | Status: AC
  Administered 2021-06-30: 2 mg via INTRAVENOUS
  Filled 2021-06-30: qty 2

## 2021-06-30 MED ORDER — SODIUM CHLORIDE 0.9% IV SOLUTION: Freq: Once | INTRAVENOUS | Status: DC

## 2021-06-30 MED ORDER — LABETALOL HCL 5 MG/ML IV SOLN
10.0000 mg | INTRAVENOUS | Status: DC | PRN
  Filled 2021-06-30: qty 4

## 2021-06-30 MED ORDER — ADULT MULTIVITAMIN W/MINERALS CH
1.0000 | ORAL_TABLET | Freq: Every day | ORAL | Status: DC
  Administered 2021-07-01 – 2021-07-05 (×5): 1 via ORAL
  Filled 2021-06-30 (×6): qty 1

## 2021-06-30 MED ORDER — CHLORHEXIDINE GLUCONATE 0.12 % MT SOLN
15.0000 mL | Freq: Once | OROMUCOSAL | Status: AC
  Administered 2021-06-30: 15 mL via OROMUCOSAL
  Filled 2021-06-30: qty 15

## 2021-06-30 MED ORDER — PROMETHAZINE HCL 25 MG PO TABS
12.5000 mg | ORAL_TABLET | ORAL | Status: DC | PRN
  Administered 2021-07-01 – 2021-07-04 (×9): 25 mg via ORAL
  Filled 2021-06-30 (×10): qty 1

## 2021-06-30 MED ORDER — DEXAMETHASONE SODIUM PHOSPHATE 10 MG/ML IJ SOLN
INTRAMUSCULAR | Status: AC
  Filled 2021-06-30: qty 1

## 2021-06-30 MED ORDER — PANTOPRAZOLE SODIUM 40 MG IV SOLR
40.0000 mg | Freq: Every day | INTRAVENOUS | Status: DC
  Administered 2021-06-30 – 2021-07-01 (×2): 40 mg via INTRAVENOUS
  Filled 2021-06-30 (×2): qty 40

## 2021-06-30 MED ORDER — LEVETIRACETAM IN NACL 500 MG/100ML IV SOLN
500.0000 mg | Freq: Two times a day (BID) | INTRAVENOUS | Status: DC
  Administered 2021-06-30 – 2021-07-02 (×4): 500 mg via INTRAVENOUS
  Filled 2021-06-30 (×4): qty 100

## 2021-06-30 MED ORDER — VANCOMYCIN HCL 750 MG/150ML IV SOLN
750.0000 mg | Freq: Two times a day (BID) | INTRAVENOUS | Status: AC
  Administered 2021-06-30 – 2021-07-01 (×2): 750 mg via INTRAVENOUS
  Filled 2021-06-30 (×2): qty 150

## 2021-06-30 MED ORDER — CHLORHEXIDINE GLUCONATE CLOTH 2 % EX PADS
6.0000 | MEDICATED_PAD | Freq: Every day | CUTANEOUS | Status: DC
  Administered 2021-07-02 – 2021-07-04 (×3): 6 via TOPICAL

## 2021-06-30 MED ORDER — BUPIVACAINE HCL (PF) 0.5 % IJ SOLN
INTRAMUSCULAR | Status: AC
  Filled 2021-06-30: qty 30

## 2021-06-30 SURGICAL SUPPLY — 63 items
BAG COUNTER SPONGE SURGICOUNT (BAG) ×4 IMPLANT
BAND RUBBER #18 3X1/16 STRL (MISCELLANEOUS) ×4 IMPLANT
BIT DRILL WIRE PASS 1.3MM (BIT) IMPLANT
BLADE SURG 15 STRL LF DISP TIS (BLADE) ×1 IMPLANT
BLADE SURG 15 STRL SS (BLADE) ×1
BNDG GAUZE ELAST 4 BULKY (GAUZE/BANDAGES/DRESSINGS) ×4 IMPLANT
BNDG STRETCH 4X75 NS LF (GAUZE/BANDAGES/DRESSINGS) ×2 IMPLANT
BUR ACORN 6.0 PRECISION (BURR) ×2 IMPLANT
BUR MATCHSTICK NEURO 3.0 LAGG (BURR) IMPLANT
BUR ROUND FLUTED 4 SOFT TCH (BURR) ×2 IMPLANT
BUR ROUND FLUTED 5 RND (BURR) IMPLANT
BUR SPIRAL ROUTER 2.3 (BUR) ×2 IMPLANT
CANISTER SUCT 3000ML PPV (MISCELLANEOUS) ×6 IMPLANT
CARTRIDGE OIL MAESTRO DRILL (MISCELLANEOUS) ×1 IMPLANT
CLIP ANEURY TI PERM MINI 6.6M (Clip) ×2 IMPLANT
COVER BURR HOLE UNIV 10 (Orthopedic Implant) ×2 IMPLANT
DIFFUSER DRILL AIR PNEUMATIC (MISCELLANEOUS) ×2 IMPLANT
DRAPE MICROSCOPE LEICA (MISCELLANEOUS) ×2 IMPLANT
DRAPE NEUROLOGICAL W/INCISE (DRAPES) ×2 IMPLANT
DRAPE WARM FLUID 44X44 (DRAPES) ×2 IMPLANT
DRILL WIRE PASS 1.3MM (BIT)
DRSG ADAPTIC 3X8 NADH LF (GAUZE/BANDAGES/DRESSINGS) ×2 IMPLANT
DRSG TELFA 3X8 NADH (GAUZE/BANDAGES/DRESSINGS) ×2 IMPLANT
DURAPREP 6ML APPLICATOR 50/CS (WOUND CARE) ×2 IMPLANT
ELECT REM PT RETURN 9FT ADLT (ELECTROSURGICAL) ×2
ELECTRODE REM PT RTRN 9FT ADLT (ELECTROSURGICAL) ×1 IMPLANT
FORCEPS BIPOLAR SPETZLER 8 1.0 (NEUROSURGERY SUPPLIES) ×2 IMPLANT
GAUZE 4X4 16PLY ~~LOC~~+RFID DBL (SPONGE) ×4 IMPLANT
GLOVE SURG ENC MOIS LTX SZ7.5 (GLOVE) IMPLANT
GLOVE SURG LTX SZ7 (GLOVE) ×4 IMPLANT
GLOVE SURG UNDER POLY LF SZ7.5 (GLOVE) ×4 IMPLANT
GOWN STRL REUS W/ TWL LRG LVL3 (GOWN DISPOSABLE) ×2 IMPLANT
GOWN STRL REUS W/ TWL XL LVL3 (GOWN DISPOSABLE) IMPLANT
GOWN STRL REUS W/TWL 2XL LVL3 (GOWN DISPOSABLE) IMPLANT
GOWN STRL REUS W/TWL LRG LVL3 (GOWN DISPOSABLE) ×2
GOWN STRL REUS W/TWL XL LVL3 (GOWN DISPOSABLE)
HEMOSTAT POWDER KIT SURGIFOAM (HEMOSTASIS) ×2 IMPLANT
HOOK DURA 1/2IN (MISCELLANEOUS) ×2 IMPLANT
KIT BASIN OR (CUSTOM PROCEDURE TRAY) ×2 IMPLANT
KIT TURNOVER KIT B (KITS) ×2 IMPLANT
KNIFE ARACHNOID DISP AM-24-S (MISCELLANEOUS) ×2 IMPLANT
NEEDLE HYPO 25X1 1.5 SAFETY (NEEDLE) ×2 IMPLANT
NS IRRIG 1000ML POUR BTL (IV SOLUTION) ×6 IMPLANT
OIL CARTRIDGE MAESTRO DRILL (MISCELLANEOUS) ×2
PACK BATTERY CMF DISP FOR DVR (ORTHOPEDIC DISPOSABLE SUPPLIES) ×2 IMPLANT
PACK CRANIOTOMY CUSTOM (CUSTOM PROCEDURE TRAY) ×2 IMPLANT
PIN MAYFIELD SKULL DISP (PIN) ×2 IMPLANT
PLATE CRANIAL 12 2H RIGID UNI (Plate) ×2 IMPLANT
PLATE UNIV CMF 16 2H (Plate) ×2 IMPLANT
SCREW UNIII AXS SD 1.5X4 (Screw) ×20 IMPLANT
SPONGE SURGIFOAM ABS GEL 100 (HEMOSTASIS) ×2 IMPLANT
STAPLER VISISTAT 35W (STAPLE) ×2 IMPLANT
STOCKINETTE 6  STRL (DRAPES) ×1
STOCKINETTE 6 STRL (DRAPES) ×1 IMPLANT
SUT NURALON 4 0 TR CR/8 (SUTURE) ×2 IMPLANT
SUT VIC AB 0 CT1 18XCR BRD8 (SUTURE) ×1 IMPLANT
SUT VIC AB 0 CT1 8-18 (SUTURE) ×1
SUT VIC AB 3-0 SH 8-18 (SUTURE) ×2 IMPLANT
TAPE PAPER 1X10 WHT MICROPORE (GAUZE/BANDAGES/DRESSINGS) ×2 IMPLANT
TOWEL GREEN STERILE (TOWEL DISPOSABLE) ×2 IMPLANT
TOWEL GREEN STERILE FF (TOWEL DISPOSABLE) ×2 IMPLANT
TRAY FOLEY MTR SLVR 16FR STAT (SET/KITS/TRAYS/PACK) ×2 IMPLANT
WATER STERILE IRR 1000ML POUR (IV SOLUTION) ×2 IMPLANT

## 2021-06-30 NOTE — Progress Notes (Addendum)
Pharmacy Antibiotic Note  Bethany Lowery is a 50 y.o. female admitted on 06/30/2021 with surgical prophylaxis.  Pharmacy has been consulted for Vancomycin dosing for 24 hours only per consult.   Vanc pre op given at 0849 AM.  Last SCr 0.63 on 7/11, estimated CrCl ~75-80 mL/min.    Plan: Vancomycin 750mg  IV every 12 hours x2 doses to complete 24 hours.  No further doses needed, Pharmacy will sign off. Please reconsult if needed.   Height: 5\' 5"  (165.1 cm) Weight: 67.6 kg (149 lb) IBW/kg (Calculated) : 57  Temp (24hrs), Avg:97.7 F (36.5 C), Min:97.3 F (36.3 C), Max:97.9 F (36.6 C)  Recent Labs  Lab 06/27/21 1400  WBC 8.5  CREATININE 0.63    Estimated Creatinine Clearance: 77.4 mL/min (by C-G formula based on SCr of 0.63 mg/dL).    Allergies  Allergen Reactions   Other Nausea And Vomiting    Pt has a hard time waking up after anesthesia, prefers light medication or dose   Penicillins Hives   Tramadol Hives    Allergic to brand Ultram   Zofran [Ondansetron] Hives    Antimicrobials this admission: Vanc post op ppx  Dose adjustments this admission:  Microbiology results:   Thank you for allowing pharmacy to be a part of this patient's care.  , PharmD, BCPS, BCCCP Clinical Pharmacist Please refer to [facility] for [facility] Pharmacy numbers 06/30/2021 3:59 PM

## 2021-06-30 NOTE — Anesthesia Procedure Notes (Addendum)
Procedure Name: Intubation Date/Time: 06/30/2021 10:15 AM Performed by: Barrington Ellison, CRNA Pre-anesthesia Checklist: Patient identified, Emergency Drugs available, Suction available and Patient being monitored Patient Re-evaluated:Patient Re-evaluated prior to induction Oxygen Delivery Method: Circle System Utilized Preoxygenation: Pre-oxygenation with 100% oxygen Induction Type: IV induction Ventilation: Mask ventilation without difficulty Laryngoscope Size: Mac and 3 Grade View: Grade I Tube type: Oral Tube size: 7.0 mm Number of attempts: 1 Airway Equipment and Method: Stylet and Bite block Placement Confirmation: ETT inserted through vocal cords under direct vision, positive ETCO2 and breath sounds checked- equal and bilateral Secured at: 23 cm Tube secured with: Tape Dental Injury: Teeth and Oropharynx as per pre-operative assessment  Comments: Intubation done by Victorino Sparrow

## 2021-06-30 NOTE — H&P (Signed)
Chief Complaint   Aneurysm  History of Present Illness  Bethany Lowery is a 50 y.o. female initially seen in the outpatient neurosurgery clinic with incidental discovery of a anterior communicating artery aneurysm.  Patient has a strong family history of intracranial aneurysms and subarachnoid hemorrhage.  She did undergo diagnostic cerebral angiogram confirming the presence of a relatively small anterior communicating artery aneurysm in the setting of a dominant right A1.  Treatment options were discussed in the outpatient setting including continued expectant observation as a reasonable option versus treatment of the aneurysm given her young age and strong family history.  Based on angiographic findings, it was felt that if treatment were pursued, surgical clipping would be preferable to attempted stent supported coiling of a small a comm aneurysm.  Patient did elect to proceed with treatment.  She therefore presents today for surgery.  Past Medical History   Past Medical History:  Diagnosis Date   Anxiety    Depression    Heart murmur    Hypertension    PONV (postoperative nausea and vomiting)     Past Surgical History   Past Surgical History:  Procedure Laterality Date   BACK SURGERY     BREAST SURGERY     EYE SURGERY     IR ANGIO INTRA EXTRACRAN SEL INTERNAL CAROTID BILAT MOD SED  05/24/2021   IR ANGIO VERTEBRAL SEL VERTEBRAL BILAT MOD SED  05/24/2021    Social History   Social History   Tobacco Use   Smoking status: Never   Smokeless tobacco: Never  Vaping Use   Vaping Use: Never used  Substance Use Topics   Alcohol use: Not Currently   Drug use: Not Currently    Medications   Prior to Admission medications   Medication Sig Start Date End Date Taking? Authorizing Provider  amLODipine (NORVASC) 10 MG tablet Take 10 mg by mouth daily.   Yes [provider]  BIOTIN PO Take 1 tablet by mouth daily.   Yes [provider]  cetirizine (ZYRTEC) 10 MG  tablet Take 10 mg by mouth daily as needed for allergies.   Yes [provider]  cholecalciferol (VITAMIN D3) 25 MCG (1000 UNIT) tablet Take 1,000 Units by mouth daily.   Yes [provider]  HYDROcodone-acetaminophen (NORCO/VICODIN) 5-325 MG tablet Take 1 tablet by mouth every 6 (six) hours as needed for moderate pain.   Yes [provider]  Multiple Vitamin (MULTIVITAMIN WITH MINERALS) TABS tablet Take 1 tablet by mouth daily.   Yes [provider]  Omega-3 Fatty Acids (FISH OIL PO) Take 1 capsule by mouth daily.   Yes [provider]  venlafaxine (EFFEXOR) 25 MG tablet Take 12.5 mg by mouth 3 (three) times a week.   Yes [provider]  vitamin E 180 MG (400 UNITS) capsule Take 400 Units by mouth daily.   Yes [provider]  metoprolol tartrate (LOPRESSOR) 50 MG tablet Take 1 tablet (50 mg total) by mouth once for 1 dose. PLEASE TAKE METOPROLOL 2  HOURS PRIOR TO CTA SCAN. 03/16/21 03/16/21  Parke Poisson, MD    Allergies   Allergies  Allergen Reactions   Other Nausea And Vomiting    Pt has a hard time waking up after anesthesia, prefers light medication or dose   Penicillins Hives   Tramadol Hives    Allergic to brand Ultram   Zofran [Ondansetron] Hives    Review of Systems  ROS  Neurologic Exam  Awake, alert, oriented  Memory and concentration grossly intact Speech fluent, appropriate CN grossly intact Motor exam: Upper Extremities Deltoid Bicep Tricep Grip  Right 5/5 5/5 5/5 5/5  Left 5/5 5/5 5/5 5/5   Lower Extremities IP Quad PF DF EHL  Right 5/5 5/5 5/5 5/5 5/5  Left 5/5 5/5 5/5 5/5 5/5   Sensation grossly intact to LT  Imaging  Diagnostic cerebral angiogram again reveals a small relatively wide necked aneurysm at the right A1 A2 junction in the setting of a dominant right A1.  Impression  - 50 y.o. female with small anterior communicating artery aneurysm and a strong family history of intracranial  aneurysms and subarachnoid hemorrhage.  She has elected to proceed with treatment of the aneurysm.  Plan  -We will proceed with right pterional craniotomy for clipping of small wide necked a comm aneurysm  I have reviewed the treatment options at length with the patient in the office.  We have discussed the risks, benefits, and alternatives to surgery as well as the expected postoperative course and recovery.  All her questions today were answered.  She has provided informed consent to proceed.  [name], MD [facility]

## 2021-06-30 NOTE — Op Note (Signed)
NEUROSURGERY OPERATIVE NOTE   PREOP DIAGNOSIS:  Unruptured anterior communicating artery aneurysm  POSTOP DIAGNOSIS: Same  PROCEDURE: Right pterional craniotomy for clipping of anterior communicating artery aneurysm, simple Intraoperative indocyanine green video angiography Use of intraoperative microscope for microdissection  SURGEON: Dr. [name], MD  ASSISTANT: Dr. Hoyt Koch, MD  ANESTHESIA: General Endotracheal  EBL: 125cc  SPECIMENS: None  DRAINS: None  COMPLICATIONS: None immediate  CONDITION: Hemodynamically stable to postanesthesia care unit  HISTORY: Bethany Lowery is a 50 y.o. female initially presenting to the outpatient neurosurgery clinic with incidental discovery of an anterior communicating artery aneurysm on MRI.  She has a strong family history of intracranial aneurysms and subarachnoid hemorrhage.  She underwent diagnostic cerebral angiogram confirmed the presence of a wide necked anterior communicating artery aneurysm arising from the bifurcation of the right A1 in the setting of a triplicate A2.  Treatment options were discussed in detail including radiographic observation and surgical clipping.  Given her history and young age she elected to proceed with surgical clipping.  The risks, benefits, and alternatives to surgery as well as the expected postoperative course and recovery were all reviewed in detail with the patient.  Informed consent was then obtained.  PROCEDURE IN DETAIL: The patient was brought to the operating room. After induction of general anesthesia, the patient was positioned on the operative table in the [place] head holder in the supine position. All pressure points were meticulously padded. Skin incision was then marked out and prepped and draped in the usual sterile fashion.  After timeout was conducted, standard curvilinear frontotemporal skin incision was made on the right side after it was infiltrated with local  anesthetic with epinephrine.  Incision was then carried down through the subcutaneous tissue and the galea.  Raney clips were applied.  The temporalis muscle and fascia were then incised with the Bovie as was the periosteum overlying the frontal bone.  Single piece myocutaneous flap was then elevated and reflected anteriorly.  Standard frontotemporal craniotomy was then fashioned with multiple bur holes.  Hemostasis was then secured on the epidural plane with bipolar electrocautery.  High-speed drill was then used to drill down the lesser wing of the sphenoid in order to provide unobstructed view to the optical carotid cistern.  Dura was then opened in curvilinear fashion and tacked up anteriorly with 4-0 Nurolon stitches.  At this point the microscope was draped sterilely and brought into the field and the remainder of the case was done under the microscope using microdissection technique.  Initially, a subfrontal approach was employed to identify the olfactory nerve, and the optic nerve.  Arachnoid overlying the optic nerve was then opened sharply and dissection was carried laterally to identify the supraclinoid segment of the internal carotid artery.  Further dissection along the carotid was carried out including dissection of the medial portion of the sylvian fissure.  This allowed identification of the ICA bifurcation and the proximal right A1.  Frontal lobe was then further dissected away from the right optic nerve and optic chiasm to follow the right A1 into the interhemispheric fissure.  Dissection was also carried out on the contralateral side to identify the contralateral A1.  I was also easily able to identify the contralateral A2.  At this point, I dissected along the anterior communicating artery region to identify one of the A2 branches on the right side.  The neck of the aneurysm and the dome of the aneurysm became very clearly visible projecting slightly anteriorly and inferiorly.  Again, after  review of the preoperative angiogram and MRA, there was a second A2 branch arising from the right A1 that we needed to identify.  I therefore elected to remove some of the gyrus rectus in order to better visualize the interhemispheric fissure.  Erie Noe was coagulated with the bipolar and suction was used to remove a portion of the gyrus rectus.  I was then able to further dissect the interhemispheric fissure and identify the more distal A2 segment.  I was then able to dissect the frontal lobe away from the right A1 and A2 and this allowed visualization of the third A2 branch more posteriorly in the interhemispheric fissure.  I was also able to identify the large recurrent branches of Heubner arising from the proximal A2 segments.  At this point, a curved mini permanent titanium clip was selected and placed across the neck of the aneurysm.  I was then able to identify the A2 branches as well as the recurrent Heubner branches and not appear to be within the clip blades.  The anesthesia service then administered 12.5 mg of indocyanine green and video angiography was performed.  This confirmed patency of bilateral A1 segments of all 3 A2 segments and the recurrent arteries.  There was no filling of the aneurysm.  At this point the wound was irrigated with a copious amount of normal saline.  No active bleeding was identified.  The dura was then reapproximated with interrupted 4-0 Nurolon stitches.  This was covered with a sheet of Gelfoam.  The bone flap was then replaced and plated with standard titanium plates and screws.  The temporalis muscle and fascia were then reapproximated with interrupted 0 Vicryl stitches and the galea was reapproximated with interrupted 3-0 Vicryl stitches.  Skin was closed with staples.  Patient was then removed from the [place] head holder and sterile dressing was applied with a turban head wrap.  At the end of the case all sponge, needle, instrument, and cottonoid counts were correct.   The patient was then transferred to the stretcher, extubated, and taken to the postanesthesia care unit in stable hemodynamic condition.   [name], MD [facility]

## 2021-06-30 NOTE — Anesthesia Postprocedure Evaluation (Signed)
Anesthesia Post Note  Patient: Bethany Lowery  Procedure(s) Performed: PTERIONAL CRANIOTOMY FOR CLIPPING OF ANTERIOR COMMUNICATING ARTERY ANEURYSM (Right: Head)     Patient location during evaluation: PACU Anesthesia Type: General Level of consciousness: awake Pain management: pain level controlled Vital Signs Assessment: post-procedure vital signs reviewed and stable Respiratory status: spontaneous breathing, nonlabored ventilation, respiratory function stable and patient connected to nasal cannula oxygen Cardiovascular status: blood pressure returned to baseline and stable Postop Assessment: no apparent nausea or vomiting Anesthetic complications: no   No notable events documented.  Last Vitals:  Vitals:   06/30/21 1516 06/30/21 1524  BP: 125/83 123/78  Pulse: 65 74  Resp: 13 16  Temp:  36.6 C  SpO2: 100% 100%    Last Pain:  Vitals:   06/30/21 1345  TempSrc:   PainSc: 0-No pain                 Shelton Silvas

## 2021-06-30 NOTE — Transfer of Care (Signed)
Immediate Anesthesia Transfer of Care Note  Patient: Bethany Lowery  Procedure(s) Performed: PTERIONAL CRANIOTOMY FOR CLIPPING OF ANTERIOR COMMUNICATING ARTERY ANEURYSM (Right: Head)  Patient Location: PACU  Anesthesia Type:General  Level of Consciousness: drowsy  Airway & Oxygen Therapy: Patient Spontanous Breathing  Post-op Assessment: Report given to RN, Post -op Vital signs reviewed and stable and Patient moving all extremities X 4  Post vital signs: Reviewed and stable  Last Vitals:  Vitals Value Taken Time  BP 126/84 06/30/21 1345  Temp    Pulse 65 06/30/21 1347  Resp 15 06/30/21 1347  SpO2 100 % 06/30/21 1347  Vitals shown include unvalidated device data.  Last Pain:  Vitals:   06/30/21 0834  TempSrc:   PainSc: 2       Patients Stated Pain Goal: 1 (06/30/21 0834)  Complications: No notable events documented.

## 2021-06-30 NOTE — Progress Notes (Signed)
PHARMACIST - PHYSICIAN ORDER COMMUNICATION  CONCERNING: P&T Medication Policy on Herbal Medications  DESCRIPTION:  This patient's order for:  BIotin  has been noted.  This product(s) is classified as an "herbal" or natural product. Due to a lack of definitive safety studies or FDA approval, nonstandard manufacturing practices, plus the potential risk of unknown drug-drug interactions while on inpatient medications, the Pharmacy and Therapeutics Committee does not permit the use of "herbal" or natural products of this type within [facility].   ACTION TAKEN: The pharmacy department is unable to verify this order at this time and your patient has been informed of this safety policy. Please reevaluate patient's clinical condition at discharge and address if the herbal or natural product(s) should be resumed at that time.   Link Snuffer, PharmD, BCPS, BCCCP Clinical Pharmacist Please refer to [facility] for [facility] numbers 06/30/2021, 3:50 PM

## 2021-06-30 NOTE — Anesthesia Procedure Notes (Signed)
Central Venous Catheter Insertion Performed by: Shelton Silvas, MD, anesthesiologist Start/End7/14/2022 9:35 AM, 06/30/2021 9:45 AM Patient location: Pre-op. Preanesthetic checklist: patient identified, IV checked, site marked, risks and benefits discussed, surgical consent, monitors and equipment checked, pre-op evaluation, timeout performed and anesthesia consent Position: Trendelenburg Lidocaine 1% used for infiltration and patient sedated Hand hygiene performed , maximum sterile barriers used  and Seldinger technique used Catheter size: 8 Fr Total catheter length 16. Central line was placed.Double lumen Procedure performed using ultrasound guided technique. Ultrasound Notes:anatomy identified, needle tip was noted to be adjacent to the nerve/plexus identified, no ultrasound evidence of intravascular and/or intraneural injection and image(s) printed for medical record Attempts: 1 Following insertion, dressing applied, line sutured and Biopatch. Post procedure assessment: blood return through all ports  Patient tolerated the procedure well with no immediate complications.

## 2021-07-01 NOTE — Progress Notes (Signed)
  NEUROSURGERY PROGRESS NOTE   Pt seen and examined. No issues overnight. Has HA and right eye swelling/bruising. No new N/T/W.  EXAM: Temp:  [97.3 F (36.3 C)-100.1 F (37.8 C)] 98.9 F (37.2 C) (07/15 0800) Pulse Rate:  [61-78] 66 (07/15 1000) Resp:  [12-21] 16 (07/15 1000) BP: (90-136)/(58-97) 120/77 (07/15 1000) SpO2:  [97 %-100 %] 97 % (07/15 1000) Arterial Line BP: (85-148)/(66-144) 116/86 (07/15 0700) Intake/Output      07/14 0701 07/15 0700 07/15 0701 07/16 0700   I.V. (mL/kg) 2136 (31.6) 224.8 (3.3)   IV Piggyback 348.8 150.9   Total Intake(mL/kg) 2484.7 (36.8) 375.7 (5.6)   Urine (mL/kg/hr) 4750 300 (1)   Blood 125    Total Output 4875 300   Net -2390.3 +75.7         Awake, alert, oriented Speech fluent CN intact MAE good strength Right eye with periorbital swelling/ecchymosis Headwrap in place  LABS: Lab Results  Component Value Date   CREATININE 0.63 06/27/2021   BUN 7 06/27/2021   NA 138 06/30/2021   K 3.5 06/30/2021   CL 104 06/27/2021   CO2 24 06/27/2021   Lab Results  Component Value Date   WBC 8.5 06/27/2021   HGB 10.9 (L) 06/30/2021   HCT 32.0 (L) 06/30/2021   MCV 100.9 (H) 06/27/2021   PLT 251 06/27/2021    IMPRESSION: - 50 y.o. female POD#1 elective right pterional crani for clipping of Acom aneurysm, doing well  PLAN: - d/c aline, foley, CVC - Mobilize today - Cont Keppra - May d/c home over weekend if stable.  I have discussed the plan above with the patient and her husband. All quesitons were answered.   [name], MD [facility]

## 2021-07-02 MED ORDER — LEVETIRACETAM 500 MG PO TABS
500.0000 mg | ORAL_TABLET | Freq: Two times a day (BID) | ORAL | Status: DC
  Administered 2021-07-02 – 2021-07-05 (×6): 500 mg via ORAL
  Filled 2021-07-02 (×6): qty 1

## 2021-07-02 MED ORDER — PANTOPRAZOLE SODIUM 40 MG PO TBEC
40.0000 mg | DELAYED_RELEASE_TABLET | Freq: Every day | ORAL | Status: DC
  Administered 2021-07-02 – 2021-07-04 (×3): 40 mg via ORAL
  Filled 2021-07-02 (×2): qty 1

## 2021-07-02 NOTE — Evaluation (Signed)
Physical Therapy Evaluation Patient Details Name: Bethany Lowery MRN: [id number] DOB: [birth year] Today's Date: 07/02/2021   History of Present Illness  Pt is a 50 y.o. 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 Significant PMH: back surgery.  Clinical Impression  Prior to admission, pt lives with her spouse and is independent. Pt presenting with decreased functional mobility secondary to low vision (R eye swollen shut, L eye painful to open) and head/cervical pain. Pt initially sitting up on edge of bed, but then immediately having to lie down due to nausea. RN present to provide IV medication and assisted pt onto bedpan per her request. Following medication, pt then able to ambulate in room with handheld assist. Suspect pt will progress well given appropriate pain control/management. Will continue to follow acutely to promote mobility as tolerated.     Follow Up Recommendations No PT follow up;Supervision/Assistance - 24 hour    Equipment Recommendations  None recommended by PT    Recommendations for Other Services       Precautions / Restrictions Precautions Precautions: Fall Restrictions Weight Bearing Restrictions: No      Mobility  Bed Mobility Overal bed mobility: Needs Assistance Bed Mobility: Supine to Sit;Sit to Supine     Supine to sit: Min assist Sit to supine: Modified independent (Device/Increase time)   General bed mobility comments: MinA to pull up to sitting position, modI for return to bed    Transfers Overall transfer level: Needs assistance Equipment used: None Transfers: Sit to/from Stand Sit to Stand: Min guard            Ambulation/Gait Ambulation/Gait assistance: Min guard Gait Distance (Feet): 30 Feet Assistive device: 1 person hand held assist Gait Pattern/deviations: WFL(Within Functional Limits)     General Gait Details: handheld assist and verbal  cueing for environmental guidance in setting of decreased vision  Stairs            Wheelchair Mobility    Modified Rankin (Stroke Patients Only)       Balance Overall balance assessment: No apparent balance deficits (not formally assessed)                                           Pertinent Vitals/Pain Pain Assessment: Faces Faces Pain Scale: Hurts whole lot Pain Location: head, neck Pain Descriptors / Indicators: Grimacing;Guarding Pain Intervention(s): Limited activity within patient's tolerance;Monitored during session    Home Living Family/patient expects to be discharged to:: Private residence Living Arrangements: Spouse/significant other Available Help at Discharge: Family Type of Home: House Home Access: Stairs to enter   Secretary/administrator of Steps: 1 Home Layout: Two level        Prior Function Level of Independence: Independent         Comments: recent back sx early May, not working     Hand Dominance        Extremity/Trunk Assessment   Upper Extremity Assessment Upper Extremity Assessment: Overall WFL for tasks assessed    Lower Extremity Assessment Lower Extremity Assessment: Overall WFL for tasks assessed    Cervical / Trunk Assessment Cervical / Trunk Assessment: Normal  Communication   Communication: No difficulties  Cognition Arousal/Alertness: Awake/alert Behavior During Therapy: WFL for tasks assessed/performed Overall Cognitive Status: Within Functional Limits for tasks assessed  General Comments      Exercises     Assessment/Plan    PT Assessment Patient needs continued PT services  PT Problem List Decreased activity tolerance;Decreased mobility;Pain       PT Treatment Interventions Gait training;Stair training;Functional mobility training;Therapeutic exercise;Therapeutic activities;Balance training;Patient/family education    PT  Goals (Current goals can be found in the Care Plan section)  Acute Rehab PT Goals Patient Stated Goal: less pain PT Goal Formulation: With patient Time For Goal Achievement: 07/16/21 Potential to Achieve Goals: Good    Frequency Min 4X/week   Barriers to discharge        Co-evaluation               AM-PAC PT "6 Clicks" Mobility  Outcome Measure Help needed turning from your back to your side while in a flat bed without using bedrails?: None Help needed moving from lying on your back to sitting on the side of a flat bed without using bedrails?: A Little Help needed moving to and from a bed to a chair (including a wheelchair)?: A Little Help needed standing up from a chair using your arms (e.g., wheelchair or bedside chair)?: A Little Help needed to walk in hospital room?: A Little Help needed climbing 3-5 steps with a railing? : A Little 6 Click Score: 19    End of Session   Activity Tolerance: Patient limited by pain Patient left: in bed;with call bell/phone within reach Nurse Communication: Mobility status PT Visit Diagnosis: Pain;Difficulty in walking, not elsewhere classified (R26.2) Pain - part of body:  (head)    Time: 8453-6468 PT Time Calculation (min) (ACUTE ONLY): 26 min   Charges:   PT Evaluation $PT Eval Moderate Complexity: 1 Mod PT Treatments $Therapeutic Activity: 8-22 mins        Lillia Pauls, PT, DPT Acute Rehabilitation Services Pager [phone] Office [phone]   Norval Morton 07/02/2021, 4:05 PM

## 2021-07-02 NOTE — Progress Notes (Signed)
   Providing Compassionate, Quality Care - Together  NEUROSURGERY PROGRESS NOTE   S: No issues overnight.   Complains of periincisional headaches.  O: EXAM:  BP 122/77   Pulse 69   Temp 99.1 F (37.3 C) (Axillary)   Resp 15   Ht 5\' 5"  (1.651 m)   Wt 67.6 kg   LMP 06/25/2021   SpO2 98%   BMI 24.79 kg/m   Awake, alert, oriented x3 Right eye swollen shut, left pupil 4 mm, reactive Speech fluent, appropriate  CNs grossly intact  5/5 BUE/BLE   ASSESSMENT:  50 y.o. female with   A comm aneurysm  Status post right craniotomy for elective clipping of a comm aneurysm  PLAN: -PT/OT -Mobilization -Pain control    Thank you for allowing me to participate in this patient's care.  Please do not hesitate to call with questions or concerns.   52, DO Neurosurgeon [facility] Cell: [phone]

## 2021-07-03 MED ORDER — ALUM & MAG HYDROXIDE-SIMETH 200-200-20 MG/5ML PO SUSP
30.0000 mL | ORAL | Status: DC | PRN
  Administered 2021-07-03: 30 mL via ORAL
  Filled 2021-07-03: qty 30

## 2021-07-03 MED ORDER — ENSURE ENLIVE PO LIQD
237.0000 mL | Freq: Two times a day (BID) | ORAL | Status: DC
  Administered 2021-07-03 – 2021-07-04 (×2): 237 mL via ORAL

## 2021-07-03 NOTE — Progress Notes (Signed)
   Providing Compassionate, Quality Care - Together  NEUROSURGERY PROGRESS NOTE   S: No issues overnight.  Complains of headache appropriately  O: EXAM:  BP 133/74   Pulse 69   Temp 98.4 F (36.9 C) (Oral)   Resp 12   Ht 5\' 5"  (1.651 m)   Wt 67.6 kg   LMP 06/25/2021   SpO2 97%   BMI 24.79 kg/m   Awake, alert, oriented x3 Right eye swollen shut, left pupil 4 mm, reactive Speech fluent, appropriate CNs grossly intact 5/5 BUE/BLE   ASSESSMENT:  50 y.o. female with   A comm aneurysm   Status post right craniotomy for elective clipping of a comm aneurysm   PLAN: -PT/OT -Mobilization -Pain control -DC planning Monday    Thank you for allowing me to participate in this patient's care.  Please do not hesitate to call with questions or concerns.   Thursday, DO Neurosurgeon [facility] Cell: [phone]

## 2021-07-03 NOTE — Progress Notes (Signed)
Initial Nutrition Assessment  DOCUMENTATION CODES:  Not applicable  INTERVENTION:  Liberalize diet to regular as pt has no hx of DM. Ensure Enlive po BID, each supplement provides 350 kcal and 20 grams of protein  NUTRITION DIAGNOSIS:  Inadequate oral intake related to poor appetite, nausea as evidenced by per patient/family report.  GOAL:  Patient will meet greater than or equal to 90% of their needs  MONITOR:  PO intake, Supplement acceptance  REASON FOR ASSESSMENT:  Consult Assessment of nutrition requirement/status  ASSESSMENT:  50 y.o. female admitted after planned surgery after outpatient imaging incidentally detected an anterior communicating artery aneurysm.  PMH relevant for HTN and strong family hx of SAH and ICA.  Pt underwent right pterional craniotomy for clipping 7/14. Per MD, will likely be able to dc 7/18.  Unable to reach pt on room phone at this time. MD noted in consult that pt was having a difficult time with PO intake due to nausea after surgery. Noted anti-emetics are ordered and being utilized prn. Discussed intake with RN. Reports pt is not complaining of nausea this AM and did state she was hungry for breakfast.   No recorded meal intake to review this admission, but weight appears stable for >5 months. Will add nutrition supplements BID to ensure post-op nutrition needs are met and provide nutrition if nausea returns.   Intake/Output Summary (Last 24 hours) at 07/03/2021 0900 Last data filed at 07/03/2021 0600 Gross per 24 hour  Intake 1778.85 ml  Output --  Net 1778.85 ml  Net IO Since Admission: -476.99 mL [07/03/21 0900]  Nutritionally Relevant Medications: Scheduled Meds:  cholecalciferol  1,000 Units Oral Daily   multivitamin with minerals  1 tablet Oral Daily   omega-3 acid ethyl esters  1 g Oral Daily   pantoprazole  40 mg Oral QHS   vitamin E  400 Units Oral Daily   Continuous Infusions:  sodium chloride 75 mL/hr at 07/03/21 0600    PRN Meds: ondansetron, promethazine, senna-docusate  Labs Reviewed  NUTRITION - FOCUSED PHYSICAL EXAM: Defer to in-person assessment  Diet Order:   Diet Order             Diet regular Room service appropriate? Yes with Assist; Fluid consistency: Thin  Diet effective now                   EDUCATION NEEDS:  No education needs have been identified at this time  Skin:  Skin Assessment: Skin Integrity Issues: Skin Integrity Issues:: Incisions Incisions: head  Last BM:  PTA  Height:  Ht Readings from Last 1 Encounters:  06/30/21 5' 5"  (1.651 m)    Weight:  Wt Readings from Last 1 Encounters:  06/30/21 67.6 kg    Ideal Body Weight:  56.8 kg  BMI:  Body mass index is 24.79 kg/m.  Estimated Nutritional Needs:  Kcal:  1600-1800 kcal/d Protein:  80-90 g/d Fluid:  >1.8L/d   Ranell Patrick, RD, LDN Clinical Dietitian Pager on [place]

## 2021-07-03 NOTE — Evaluation (Signed)
Occupational Therapy Evaluation Patient Details Name: Bethany Lowery MRN: [id number] DOB: [birth year] Today's Date: 07/03/2021    History of Present Illness Pt is a 50 y.o. 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 Significant PMH: back surgery.   Clinical Impression   This 50 yo female admitted and underwent above presents to acute OT with PLOF of being totally independent with all basic and IADLs. Currently pt is at a setup-min A level for all basic ADLs and mobility due to decreased vision from eyes swelling and pt also with dizziness. She will have her husband and kids at home to A her prn. We will continue to follow acutely without current need for post OT follow up once D/C'd.    Follow Up Recommendations  No OT follow up;Supervision/Assistance - 24 hour    Equipment Recommendations  None recommended by OT       Precautions / Restrictions Precautions Precautions: Fall Precaution Comments: dizziness Restrictions Weight Bearing Restrictions: No      Mobility Bed Mobility Overal bed mobility: Needs Assistance Bed Mobility: Supine to Sit;Sit to Supine     Supine to sit: Supervision Sit to supine: Supervision        Transfers Overall transfer level: Needs assistance Equipment used: 1 person hand held assist Transfers: Sit to/from Stand Sit to Stand: Min assist         General transfer comment: Min A +1 HHA to ambulate in room    Balance Overall balance assessment: Needs assistance Sitting-balance support: No upper extremity supported;Feet supported Sitting balance-Leahy Scale: Poor Sitting balance - Comments: dizzy   Standing balance support: Single extremity supported Standing balance-Leahy Scale: Poor                             ADL either performed or assessed with clinical judgement   ADL Overall ADL's : Needs assistance/impaired Eating/Feeding: Set  up;Sitting Eating/Feeding Details (indicate cue type and reason): Cues for where items are placed due to eyes swollen shut Grooming: Minimal assistance;Sitting   Upper Body Bathing: Minimal assistance;Sitting   Lower Body Bathing: Minimal assistance;Sit to/from stand   Upper Body Dressing : Minimal assistance;Sitting   Lower Body Dressing: Minimal assistance;Sit to/from stand   Toilet Transfer: Minimal assistance;Ambulation Toilet Transfer Details (indicate cue type and reason): 1 person HHA, simulated bed>around to recliner Toileting- Clothing Manipulation and Hygiene: Minimal assistance;Sit to/from stand         General ADL Comments: A level due to decreased vision and dizziness     Vision   Additional Comments: Currently cannot open eyes enought to tell if double vision or nystagmus (Bil swelling)            Pertinent Vitals/Pain Pain Assessment: Faces Faces Pain Scale: Hurts little more Pain Location: more dizziness than pain today (neck and head) Pain Descriptors / Indicators: Guarding;Sore Pain Intervention(s): Limited activity within patient's tolerance;Monitored during session;Repositioned     Hand Dominance Right   Extremity/Trunk Assessment Upper Extremity Assessment Upper Extremity Assessment: Overall WFL for tasks assessed           Communication Communication Communication: No difficulties   Cognition Arousal/Alertness: Awake/alert Behavior During Therapy: WFL for tasks assessed/performed Overall Cognitive Status: Within Functional Limits for tasks assessed  Home Living Family/patient expects to be discharged to:: Private residence Living Arrangements: Spouse/significant other Available Help at Discharge: Family;Available 24 hours/day Type of Home: House Home Access: Stairs to enter [employer] of Steps: 1   Home Layout: Two level Alternate Level Stairs-Number of  Steps: 17   Bathroom Shower/Tub: Producer, television/film/video: Standard     Home Equipment: Shower seat - built in          Prior Functioning/Environment Level of Independence: Independent        Comments: recent back sx early May, not currently employeed        OT Problem List: Pain;Decreased activity tolerance;Impaired balance (sitting and/or standing);Impaired vision/perception      OT Treatment/Interventions: Self-care/ADL training;DME and/or AE instruction;Patient/family education;Balance training    OT Goals(Current goals can be found in the care plan section) Acute Rehab OT Goals Patient Stated Goal: to be able to open my eyes OT Goal Formulation: With patient Time For Goal Achievement: 07/17/21 Potential to Achieve Goals: Good  OT Frequency: Min 2X/week              AM-PAC OT "6 Clicks" Daily Activity     Outcome Measure Help from another person eating meals?: A Little Help from another person taking care of personal grooming?: A Little Help from another person toileting, which includes using toliet, bedpan, or urinal?: A Little Help from another person bathing (including washing, rinsing, drying)?: A Little Help from another person to put on and taking off regular upper body clothing?: A Little Help from another person to put on and taking off regular lower body clothing?: A Little 6 Click Score: 18   End of Session Nurse Communication:  (asked RN to check on pt in ~15 minutes to see how she is doing as far as needing to get back to bed)  Activity Tolerance:  (limited by dizziness) Patient left: in chair;with call bell/phone within reach  OT Visit Diagnosis: Unsteadiness on feet (R26.81);Other abnormalities of gait and mobility (R26.89);Low vision, both eyes (H54.2);Pain Pain - part of body:  (head and neck)                Time: 8916-9450 OT Time Calculation (min): 18 min Charges:  OT General Charges $OT Visit: 1 Visit OT Evaluation $OT Eval  Moderate Complexity: 1 Mod Bethany Lowery, OTR/L Acute [employer] Pager [phone] Office [phone]    Bethany Lowery 07/03/2021, 9:39 AM

## 2021-07-04 ENCOUNTER — Encounter (HOSPITAL_COMMUNITY): Payer: Self-pay | Admitting: Neurosurgery

## 2021-07-04 NOTE — Progress Notes (Addendum)
Physical Therapy Treatment Patient Details Name: Bethany Lowery MRN: [id number] DOB: 1971/06/07 Today's Date: 07/04/2021    History of Present Illness Pt is a 50 y.o. 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 Significant PMH: back surgery.    PT Comments    Pt received supine; requesting to go to bathroom. Assisted to bathroom (~20 ft) with no assistive device at a min guard assist level. No gross balance deficits noted. Pt requesting to lie back down following toileting due to pain/nausea. RN provided IV pain medications and pt again refusing to further mobilize due to continued symptoms. Swelling has significantly improved and able to open left eye fully with minimal right eye opening. Pt denies diplopia. Will continue to progress mobility as tolerated.     Follow Up Recommendations  No PT follow up;Supervision/Assistance - 24 hour     Equipment Recommendations  None recommended by PT    Recommendations for Other Services       Precautions / Restrictions Precautions Precautions: Fall Restrictions Weight Bearing Restrictions: No    Mobility  Bed Mobility Overal bed mobility: Modified Independent                  Transfers Overall transfer level: Needs assistance Equipment used: None Transfers: Sit to/from Stand Sit to Stand: Supervision            Ambulation/Gait Ambulation/Gait assistance: Min guard Gait Distance (Feet): 20 Feet Assistive device: None Gait Pattern/deviations: WFL(Within Functional Limits)     General Gait Details: no gross LOB noted   Stairs             Wheelchair Mobility    Modified Rankin (Stroke Patients Only)       Balance Overall balance assessment: No apparent balance deficits (not formally assessed)                                          Cognition Arousal/Alertness: Awake/alert Behavior During  Therapy: WFL for tasks assessed/performed Overall Cognitive Status: Within Functional Limits for tasks assessed                                        Exercises      General Comments        Pertinent Vitals/Pain Pain Assessment: Faces Faces Pain Scale: Hurts whole lot Pain Location: head, neck Pain Descriptors / Indicators: Grimacing;Guarding Pain Intervention(s): Limited activity within patient's tolerance;Monitored during session;Patient requesting pain meds-RN notified    Home Living                      Prior Function            PT Goals (current goals can now be found in the care plan section) Acute Rehab PT Goals Patient Stated Goal: less pain PT Goal Formulation: With patient Time For Goal Achievement: 07/16/21 Potential to Achieve Goals: Good    Frequency    Min 4X/week      PT Plan Current plan remains appropriate    Co-evaluation              AM-PAC PT "6 Clicks" Mobility   Outcome Measure  Help needed turning from your back to your side while in a  flat bed without using bedrails?: None Help needed moving from lying on your back to sitting on the side of a flat bed without using bedrails?: A Little Help needed moving to and from a bed to a chair (including a wheelchair)?: A Little Help needed standing up from a chair using your arms (e.g., wheelchair or bedside chair)?: A Little Help needed to walk in hospital room?: A Little Help needed climbing 3-5 steps with a railing? : A Little 6 Click Score: 19    End of Session   Activity Tolerance: Patient limited by pain Patient left: in bed;with call bell/phone within reach Nurse Communication: Mobility status PT Visit Diagnosis: Pain;Difficulty in walking, not elsewhere classified (R26.2) Pain - part of body:  (head)     Time: 2025-4270 PT Time Calculation (min) (ACUTE ONLY): 10 min  Charges:  $Therapeutic Activity: 8-22 mins                     Lillia Pauls,  PT, DPT Acute Rehabilitation Services Pager [phone] Office [phone]    Norval Morton 07/04/2021, 4:35 PM

## 2021-07-04 NOTE — Plan of Care (Signed)

## 2021-07-05 MED ORDER — OXYCODONE-ACETAMINOPHEN 5-325 MG PO TABS: 1.0000 | ORAL_TABLET | ORAL | 0 refills | Status: AC | PRN

## 2021-07-05 MED ORDER — LEVETIRACETAM 500 MG PO TABS: 500.0000 mg | ORAL_TABLET | Freq: Two times a day (BID) | ORAL | 0 refills | Status: AC

## 2021-07-05 NOTE — Progress Notes (Signed)
  NEUROSURGERY PROGRESS NOTE   Pt seen and examined. No issues overnight. Cont to report HA, improved.  EXAM: Temp:  [98.2 F (36.8 C)-98.9 F (37.2 C)] 98.2 F (36.8 C) (07/19 0742) Pulse Rate:  [57-66] 57 (07/19 0742) Resp:  [10-19] 18 (07/19 0742) BP: (122-142)/(77-95) 139/89 (07/19 0742) SpO2:  [95 %-100 %] 99 % (07/19 0742) Intake/Output      07/18 0701 07/19 0700 07/19 0701 07/20 0700   I.V. (mL/kg) 397.8 (5.9)    Total Intake(mL/kg) 397.8 (5.9)    Net +397.8         Urine Occurrence 2 x     Awake, alert, oriented Speech fluent CN intact Right eye swollen, but improving MAE well Wound c/d/I  LABS: Lab Results  Component Value Date   CREATININE 0.63 06/27/2021   BUN 7 06/27/2021   NA 138 06/30/2021   K 3.5 06/30/2021   CL 104 06/27/2021   CO2 24 06/27/2021   Lab Results  Component Value Date   WBC 8.5 06/27/2021   HGB 10.9 (L) 06/30/2021   HCT 32.0 (L) 06/30/2021   MCV 100.9 (H) 06/27/2021   PLT 251 06/27/2021    IMPRESSION: - 50 y.o. female POD# 4 s/p clipping Acom aneurysm, doing well  PLAN: - transfer to floor today - Cont to mobilize - Plan d/c home tomorrow.   [name], MD [facility]

## 2021-07-05 NOTE — Progress Notes (Signed)
Occupational Therapy Treatment Patient Details Name: Bethany Lowery MRN: [id number] DOB: [birth year] Today's Date: 07/05/2021    History of present illness Pt is a 50 y.o. 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 Significant PMH: back surgery, alopecia, HTN, anxiety/ depression.   OT comments  Patient progressing well, limited by R eye swelling and pain in head/neck.  She completes mobility in room with min guard to supervision (initially requesting UE support), transfers with supervision, toileting with supervision.  Simulated tub transfers with min guard, voices understanding of technique once cleared by MD and spouse will assist.   Pt reports blurry vision in R eye with minimal opening, briefly opening both eyes minimally and reports things look "off" but denies diplopia at this time.  As swelling decreases, pt educated on possible need for OP OT vision therapy after follow up with MD--verbalized understanding.  DC plan remains appropriate at this time.    Follow Up Recommendations  No OT follow up;Supervision/Assistance - 24 hour    Equipment Recommendations  None recommended by OT    Recommendations for Other Services      Precautions / Restrictions Precautions Precautions: Fall;Back Precaution Booklet Issued: No Precaution Comments: dizziness Restrictions Weight Bearing Restrictions: No       Mobility Bed Mobility Overal bed mobility: Modified Independent             General bed mobility comments: came to EOB unassisted    Transfers Overall transfer level: Needs assistance Equipment used: None Transfers: Sit to/from Stand Sit to Stand: Supervision         General transfer comment: for safety    Balance Overall balance assessment: No apparent balance deficits (not formally assessed)                                         ADL either performed or  assessed with clinical judgement   ADL Overall ADL's : Needs assistance/impaired     Grooming: Supervision/safety;Standing                   Toilet Transfer: Grab bars;Supervision/safety;Ambulation;Regular Toilet   Toileting- Architect and Hygiene: Supervision/safety;Sit to/from stand   Tub/ Shower Transfer: Tub transfer;Min guard;Ambulation Tub/Shower Transfer Details (indicate cue type and reason): simulated side stepping technique in room with UE support, spouse will assist pt Functional mobility during ADLs: Min guard;Cueing for safety       Vision   Additional Comments: Patient able to open R eye minimally, reports everything looks "off" but denies diplopia at this time.  When manually lifts eyelid of R eye reports blurry vision. Discussed monitoring and possible OP OT after follow up appt.   Perception     Praxis      Cognition Arousal/Alertness: Awake/alert Behavior During Therapy: WFL for tasks assessed/performed Overall Cognitive Status: Within Functional Limits for tasks assessed                                          Exercises     Shoulder Instructions       General Comments discussed activity progression and safety    Pertinent Vitals/ Pain       Pain Assessment: Faces Pain Score: 8  Faces Pain Scale: Hurts  even more Pain Location: head, neck Pain Descriptors / Indicators: Grimacing;Guarding Pain Intervention(s): Limited activity within patient's tolerance;Monitored during session;Repositioned  Home Living Family/patient expects to be discharged to:: Private residence Living Arrangements: Spouse/significant other                                      Prior Functioning/Environment              Frequency  Min 2X/week        Progress Toward Goals  OT Goals(current goals can now be found in the care plan section)     Acute Rehab OT Goals Patient Stated Goal: less pain OT Goal  Formulation: With patient  Plan Discharge plan remains appropriate;Frequency remains appropriate    Co-evaluation                 AM-PAC OT "6 Clicks" Daily Activity     Outcome Measure   Help from another person eating meals?: A Little Help from another person taking care of personal grooming?: A Little Help from another person toileting, which includes using toliet, bedpan, or urinal?: A Little Help from another person bathing (including washing, rinsing, drying)?: A Little Help from another person to put on and taking off regular upper body clothing?: A Little Help from another person to put on and taking off regular lower body clothing?: A Little 6 Click Score: 18    End of Session    OT Visit Diagnosis: Unsteadiness on feet (R26.81);Other abnormalities of gait and mobility (R26.89);Low vision, both eyes (H54.2);Pain Pain - part of body:  (head and neck)   Activity Tolerance Patient tolerated treatment well   Patient Left in bed;with call bell/phone within reach;with family/visitor present   Nurse Communication Mobility status        Time: 7829-5621 OT Time Calculation (min): 14 min  Charges: OT General Charges $OT Visit: 1 Visit OT Treatments $Self Care/Home Management : 8-22 mins  Barry Brunner, OT Acute Rehabilitation Services Pager [phone] Office [phone]    Chancy Milroy 07/05/2021, 1:32 PM

## 2021-07-05 NOTE — Plan of Care (Signed)

## 2021-07-05 NOTE — TOC Transition Note (Signed)
Transition of Care [facility]) - CM/SW Discharge Note   Patient Details  Name: Bethany Lowery MRN: [id number] Date of Birth: February 04, 1971  Transition of Care [facility] CM/SW Contact:  Kermit Balo, RN Phone Number: 07/05/2021, 12:24 PM   Clinical Narrative:    Patient is discharging home with self care. No f/u per PT/OT and no DME needs.  Pt has supervision at home and transport to home.   Final next level of care: Home/Self Care Barriers to Discharge: No Barriers Identified   Patient Goals and CMS Choice        Discharge Placement                       Discharge Plan and Services                                     Social Determinants of Health (SDOH) Interventions     Readmission Risk Interventions No flowsheet data found.

## 2021-07-05 NOTE — Plan of Care (Signed)

## 2021-07-05 NOTE — Progress Notes (Signed)
OT Cancellation Note  Patient Details Name: Bana Borgmeyer MRN: [id number] DOB: 1971/05/03   Cancelled Treatment:    Reason Eval/Treat Not Completed: Patient declined, no reason specified- pt reports just returning back to bed and declining OT at this time. Requesting OT to return in a few hours.  Will follow and see as able.   Barry Brunner, OT Acute Rehabilitation Services Pager [phone] Office [phone]   Chancy Milroy 07/05/2021, 8:38 AM

## 2021-07-05 NOTE — Progress Notes (Signed)
Physical Therapy Treatment Patient Details Name: Bethany Lowery MRN: [id number] DOB: [birth year] Today's Date: 07/05/2021    History of Present Illness Pt is a 50 y.o. 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 Significant PMH: back surgery, alopecia, HTN, anxiety/ depression.    PT Comments    Pt continues to have L sided head pain and swelling L eye preventing full vision so worked on scanning environment during gait in hallway. Pt ambulated 300' pushing IV pole with supervision for safety. She also safely ascended 5 steps with use of rail. Discussed activity level upon return home and pt will have needed supervision. PT will continue to follow.    Follow Up Recommendations  No PT follow up;Supervision/Assistance - 24 hour     Equipment Recommendations  None recommended by PT    Recommendations for Other Services       Precautions / Restrictions Precautions Precautions: Fall;Back Precaution Booklet Issued: No Precaution Comments: dizziness Restrictions Weight Bearing Restrictions: No    Mobility  Bed Mobility Overal bed mobility: Modified Independent             General bed mobility comments: came to EOB unassisted    Transfers Overall transfer level: Needs assistance Equipment used: None Transfers: Sit to/from Stand Sit to Stand: Supervision         General transfer comment: pt reaches for IV pole to steady, supervision given for safety  Ambulation/Gait Ambulation/Gait assistance: Supervision Gait Distance (Feet): 300 Feet Assistive device: IV Pole   Gait velocity: decreased Gait velocity interpretation: 1.31 - 2.62 ft/sec, indicative of limited community ambulator General Gait Details: guarded gait as pt works on scanning L environment, pushed IV pole for stability, no LOB   Stairs Stairs: Yes Stairs assistance: Supervision Stair Management: One rail  Right;Alternating pattern;Forwards Number of Stairs: 5 General stair comments: no difficulty with stairs   Wheelchair Mobility    Modified Rankin (Stroke Patients Only)       Balance Overall balance assessment: No apparent balance deficits (not formally assessed)                                          Cognition Arousal/Alertness: Awake/alert Behavior During Therapy: WFL for tasks assessed/performed Overall Cognitive Status: Within Functional Limits for tasks assessed                                        Exercises      General Comments General comments (skin integrity, edema, etc.): discussed activity level upon return home and brain rest periods for healing      Pertinent Vitals/Pain Pain Assessment: 0-10 Pain Score: 8  Pain Location: head, neck Pain Descriptors / Indicators: Grimacing;Guarding Pain Intervention(s): Limited activity within patient's tolerance;Monitored during session    Home Living Family/patient expects to be discharged to:: Private residence Living Arrangements: Spouse/significant other                  Prior Function            PT Goals (current goals can now be found in the care plan section) Acute Rehab PT Goals Patient Stated Goal: less pain PT Goal Formulation: With patient Time For Goal Achievement: 07/16/21 Potential to Achieve Goals: Good Progress  towards PT goals: Progressing toward goals    Frequency    Min 4X/week      PT Plan Current plan remains appropriate    Lowery-evaluation              AM-PAC PT "6 Clicks" Mobility   Outcome Measure  Help needed turning from your back to your side while in a flat bed without using bedrails?: None Help needed moving from lying on your back to sitting on the side of a flat bed without using bedrails?: None Help needed moving to and from a bed to a chair (including a wheelchair)?: A Little Help needed standing up from a chair using  your arms (e.g., wheelchair or bedside chair)?: A Little Help needed to walk in hospital room?: A Little Help needed climbing 3-5 steps with a railing? : A Little 6 Click Score: 20    End of Session   Activity Tolerance: Patient tolerated treatment well Patient left: in bed;with call bell/phone within reach Nurse Communication: Mobility status PT Visit Diagnosis: Pain;Difficulty in walking, not elsewhere classified (R26.2) Pain - part of body:  (head)     Time: 1010-1028 PT Time Calculation (min) (ACUTE ONLY): 18 min  Charges:  $Gait Training: 8-22 mins                     Bethany Lowery, PT  Acute Rehab Services  Pager [phone] Office [phone]    Bethany Lowery Bethany Lowery 07/05/2021, 12:29 PM

## 2021-07-05 NOTE — Discharge Summary (Signed)
Physician Discharge Summary  Patient ID: Bethany Lowery MRN: [id number] DOB/AGE: [birth year] 50 y.o.  Admit date: 06/30/2021 Discharge date: 07/05/2021  Admission Diagnoses:  Cerebral aneurysm  Discharge Diagnoses:  Same Active Problems:   Cerebral aneurysm   Aneurysm, cerebral, nonruptured   Discharged Condition: Stable  Hospital Course:  Bethany Lowery is a 50 y.o. female admitted after elective clipping of acom aneurysm. She was monitored in the ICU and was at neurologic baseline. She reported continued but improving HA and had improving right eye swelling. She was ambulating well, tolerating diet, with pain under control and was discharged on POD# 5.  Treatments: Surgery - right craniotomy for clipping of Acom aneurysm  Discharge Exam: Blood pressure 139/89, pulse (!) 57, temperature 98.2 F (36.8 C), temperature source Oral, resp. rate 18, height 5\' 5"  (1.651 m), weight 67.6 kg, last menstrual period 06/25/2021, SpO2 99 %. Awake, alert, oriented Speech fluent, appropriate CN grossly intact 5/5 BUE/BLE Wound c/d/i  Disposition:   Discharge Instructions     Call MD for:  redness, tenderness, or signs of infection (pain, swelling, redness, odor or green/yellow discharge around incision site)   Complete by: As directed    Call MD for:  temperature >100.4   Complete by: As directed    Diet - low sodium heart healthy   Complete by: As directed    Discharge instructions   Complete by: As directed    Walk at home as much as possible, at least 4 times / day   Increase activity slowly   Complete by: As directed    Lifting restrictions   Complete by: As directed    No lifting > 10 lbs   May shower / Bathe   Complete by: As directed    48 hours after surgery   May walk up steps   Complete by: As directed    No dressing needed   Complete by: As directed    Other Restrictions   Complete by: As directed    No bending/twisting at waist      Allergies as of 07/05/2021        Reactions   Other Nausea And Vomiting   Pt has a hard time waking up after anesthesia, prefers light medication or dose   Penicillins Hives   Tramadol Hives   Allergic to brand Ultram   Zofran [ondansetron] Hives        Medication List     STOP taking these medications    HYDROcodone-acetaminophen 5-325 MG tablet Commonly known as: NORCO/VICODIN       TAKE these medications    amLODipine 10 MG tablet Commonly known as: NORVASC Take 10 mg by mouth daily.   BIOTIN PO Take 1 tablet by mouth daily.   cetirizine 10 MG tablet Commonly known as: ZYRTEC Take 10 mg by mouth daily as needed for allergies.   cholecalciferol 25 MCG (1000 UNIT) tablet Commonly known as: VITAMIN D3 Take 1,000 Units by mouth daily.   FISH OIL PO Take 1 capsule by mouth daily.   levETIRAcetam 500 MG tablet Commonly known as: KEPPRA Take 1 tablet (500 mg total) by mouth every 12 (twelve) hours.   metoprolol tartrate 50 MG tablet Commonly known as: LOPRESSOR Take 1 tablet (50 mg total) by mouth once for 1 dose. PLEASE TAKE METOPROLOL 2  HOURS PRIOR TO CTA SCAN.   multivitamin with minerals Tabs tablet Take 1 tablet by mouth daily.   oxyCODONE-acetaminophen 5-325 MG tablet Commonly known as: PERCOCET/ROXICET Take 1  tablet by mouth every 4 (four) hours as needed for severe pain.   venlafaxine 25 MG tablet Commonly known as: EFFEXOR Take 12.5 mg by mouth 3 (three) times a week.   vitamin E 180 MG (400 UNITS) capsule Take 400 Units by mouth daily.               Discharge Care Instructions  (From admission, onward)           Start     Ordered   07/05/21 0000  No dressing needed        07/05/21 7017            Follow-up Information     [name], MD Follow up in 2 week(s).   Specialty: Neurosurgery Why: For suture removal Contact information: [address] [place] [address] [phone]                  Signed: Jackelyn Hoehn 07/05/2021, 8:05 AM

## 2021-07-07 LAB — TYPE AND SCREEN
ABO/RH(D): B POS
Antibody Screen: NEGATIVE
Unit division: 0
Unit division: 0

## 2021-07-07 LAB — BPAM RBC
Blood Product Expiration Date: 202207292359
Blood Product Expiration Date: 202208042359
ISSUE DATE / TIME: 202207141043
ISSUE DATE / TIME: 202207141043
Unit Type and Rh: 7300
Unit Type and Rh: 7300

## 2022-01-02 ENCOUNTER — Other Ambulatory Visit: Payer: Self-pay

## 2022-01-02 ENCOUNTER — Other Ambulatory Visit (HOSPITAL_COMMUNITY): Payer: Self-pay | Admitting: Neurosurgery

## 2022-01-02 ENCOUNTER — Ambulatory Visit (HOSPITAL_COMMUNITY)
Admission: RE | Admit: 2022-01-02 | Discharge: 2022-01-02 | Disposition: A | Discharge status: Home / Self Care | Payer: BC Managed Care – PPO | Source: Ambulatory Visit | Attending: Neurosurgery | Admitting: Neurosurgery

## 2022-01-02 DIAGNOSIS — S301XXS Contusion of abdominal wall, sequela: Secondary | ICD-10-CM | POA: Diagnosis present

## 2022-01-02 DIAGNOSIS — L7632 Postprocedural hematoma of skin and subcutaneous tissue following other procedure: Secondary | ICD-10-CM | POA: Diagnosis not present

## 2022-01-02 DIAGNOSIS — R200 Anesthesia of skin: Secondary | ICD-10-CM | POA: Insufficient documentation

## 2022-01-02 DIAGNOSIS — Y848 Other medical procedures as the cause of abnormal reaction of the patient, or of later complication, without mention of misadventure at the time of the procedure: Secondary | ICD-10-CM | POA: Insufficient documentation

## 2022-01-02 DIAGNOSIS — X58XXXA Exposure to other specified factors, initial encounter: Secondary | ICD-10-CM | POA: Diagnosis not present

## 2022-01-02 DIAGNOSIS — I671 Cerebral aneurysm, nonruptured: Secondary | ICD-10-CM | POA: Diagnosis not present

## 2022-06-02 ENCOUNTER — Other Ambulatory Visit: Payer: Self-pay | Admitting: Neurosurgery

## 2022-06-02 DIAGNOSIS — I671 Cerebral aneurysm, nonruptured: Secondary | ICD-10-CM

## 2022-06-27 ENCOUNTER — Other Ambulatory Visit: Payer: Self-pay | Admitting: Neurosurgery

## 2022-06-27 ENCOUNTER — Other Ambulatory Visit: Payer: Self-pay

## 2022-06-27 ENCOUNTER — Ambulatory Visit (HOSPITAL_COMMUNITY)
Admission: RE | Admit: 2022-06-27 | Discharge: 2022-06-27 | Disposition: A | Discharge status: Home / Self Care | Payer: BC Managed Care – PPO | Source: Ambulatory Visit | Attending: Neurosurgery | Admitting: Neurosurgery

## 2022-06-27 VITALS — BP 132/84 | HR 46 | Temp 98.6°F | Resp 12 | Ht 65.0 in | Wt 156.0 lb

## 2022-06-27 DIAGNOSIS — I671 Cerebral aneurysm, nonruptured: Secondary | ICD-10-CM

## 2022-06-27 HISTORY — PX: IR ANGIO INTRA EXTRACRAN SEL INTERNAL CAROTID UNI R MOD SED: IMG5362

## 2022-06-27 HISTORY — PX: IR US GUIDE VASC ACCESS RIGHT: IMG2390

## 2022-06-27 LAB — CBC WITH DIFFERENTIAL/PLATELET
Abs Immature Granulocytes: 0.02 10*3/uL (ref 0.00–0.07)
Basophils Absolute: 0 10*3/uL (ref 0.0–0.1)
Basophils Relative: 1 %
Eosinophils Absolute: 0.2 10*3/uL (ref 0.0–0.5)
Eosinophils Relative: 4 %
HCT: 39 % (ref 36.0–46.0)
Hemoglobin: 12.6 g/dL (ref 12.0–15.0)
Immature Granulocytes: 0 %
Lymphocytes Relative: 30 %
Lymphs Abs: 1.9 10*3/uL (ref 0.7–4.0)
MCH: 31.5 pg (ref 26.0–34.0)
MCHC: 32.3 g/dL (ref 30.0–36.0)
MCV: 97.5 fL (ref 80.0–100.0)
Monocytes Absolute: 0.6 10*3/uL (ref 0.1–1.0)
Monocytes Relative: 10 %
Neutro Abs: 3.4 10*3/uL (ref 1.7–7.7)
Neutrophils Relative %: 55 %
Platelets: 248 10*3/uL (ref 150–400)
RBC: 4 MIL/uL (ref 3.87–5.11)
RDW: 13.7 % (ref 11.5–15.5)
WBC: 6.2 10*3/uL (ref 4.0–10.5)
nRBC: 0 % (ref 0.0–0.2)

## 2022-06-27 LAB — URINALYSIS, ROUTINE W REFLEX MICROSCOPIC
Bacteria, UA: NONE SEEN
Bilirubin Urine: NEGATIVE
Glucose, UA: NEGATIVE mg/dL
Ketones, ur: NEGATIVE mg/dL
Leukocytes,Ua: NEGATIVE
Nitrite: NEGATIVE
Protein, ur: NEGATIVE mg/dL
Specific Gravity, Urine: 1.013 (ref 1.005–1.030)
pH: 5 (ref 5.0–8.0)

## 2022-06-27 LAB — BASIC METABOLIC PANEL
Anion gap: 10 (ref 5–15)
BUN: 10 mg/dL (ref 6–20)
CO2: 22 mmol/L (ref 22–32)
Calcium: 9.2 mg/dL (ref 8.9–10.3)
Chloride: 108 mmol/L (ref 98–111)
Creatinine, Ser: 0.73 mg/dL (ref 0.44–1.00)
GFR, Estimated: >60 mL/min (ref >60)
Glucose, Bld: 93 mg/dL (ref 70–99)
Potassium: 4 mmol/L (ref 3.5–5.1)
Sodium: 140 mmol/L (ref 135–145)

## 2022-06-27 LAB — APTT: aPTT: 26 seconds (ref 24–36)

## 2022-06-27 LAB — PREGNANCY, URINE: Preg Test, Ur: NEGATIVE

## 2022-06-27 LAB — PROTIME-INR
INR: 1 (ref 0.8–1.2)
Prothrombin Time: 12.8 seconds (ref 11.4–15.2)

## 2022-06-27 MED ORDER — VERAPAMIL HCL 2.5 MG/ML IV SOLN
INTRAVENOUS | Status: AC
  Filled 2022-06-27: qty 2

## 2022-06-27 MED ORDER — VERAPAMIL HCL 2.5 MG/ML IV SOLN: INTRA_ARTERIAL | Status: AC | PRN

## 2022-06-27 MED ORDER — SODIUM CHLORIDE 0.9 % IV SOLN: INTRAVENOUS | Status: DC

## 2022-06-27 MED ORDER — MIDAZOLAM HCL 2 MG/2ML IJ SOLN
INTRAMUSCULAR | Status: AC | PRN
  Administered 2022-06-27: 1 mg via INTRAVENOUS

## 2022-06-27 MED ORDER — FENTANYL CITRATE (PF) 100 MCG/2ML IJ SOLN
INTRAMUSCULAR | Status: AC
  Filled 2022-06-27: qty 2

## 2022-06-27 MED ORDER — MIDAZOLAM HCL 2 MG/2ML IJ SOLN
INTRAMUSCULAR | Status: AC
  Filled 2022-06-27: qty 2

## 2022-06-27 MED ORDER — LIDOCAINE HCL 1 % IJ SOLN
INTRAMUSCULAR | Status: AC
  Filled 2022-06-27: qty 20

## 2022-06-27 MED ORDER — FENTANYL CITRATE (PF) 100 MCG/2ML IJ SOLN
INTRAMUSCULAR | Status: AC | PRN
  Administered 2022-06-27: 25 ug via INTRAVENOUS

## 2022-06-27 MED ORDER — CHLORHEXIDINE GLUCONATE CLOTH 2 % EX PADS: 6.0000 | MEDICATED_PAD | Freq: Once | CUTANEOUS | Status: DC

## 2022-06-27 MED ORDER — SODIUM CHLORIDE 0.9 % IV SOLN
12.5000 mg | Freq: Once | INTRAVENOUS | Status: DC
  Filled 2022-06-27: qty 0.5

## 2022-06-27 MED ORDER — HEPARIN SODIUM (PORCINE) 1000 UNIT/ML IJ SOLN
INTRAMUSCULAR | Status: AC | PRN
  Administered 2022-06-27: 3000 [IU] via INTRAVENOUS

## 2022-06-27 MED ORDER — LIDOCAINE HCL (PF) 1 % IJ SOLN
INTRAMUSCULAR | Status: AC | PRN
  Administered 2022-06-27: 2 mL

## 2022-06-27 MED ORDER — HEPARIN SODIUM (PORCINE) 1000 UNIT/ML IJ SOLN
INTRAMUSCULAR | Status: AC
  Filled 2022-06-27: qty 10

## 2022-06-27 MED ORDER — IOHEXOL 300 MG/ML  SOLN
100.0000 mL | Freq: Once | INTRAMUSCULAR | Status: AC | PRN
  Administered 2022-06-27: 20 mL via INTRA_ARTERIAL

## 2022-06-27 MED ORDER — VANCOMYCIN HCL IN DEXTROSE 1-5 GM/200ML-% IV SOLN: 1000.0000 mg | INTRAVENOUS | Status: DC

## 2022-06-27 MED ORDER — NITROGLYCERIN 1 MG/10 ML FOR IR/CATH LAB
INTRA_ARTERIAL | Status: AC
  Filled 2022-06-27: qty 10

## 2022-06-27 MED ORDER — HYDROCODONE-ACETAMINOPHEN 5-325 MG PO TABS: 1.0000 | ORAL_TABLET | ORAL | Status: DC | PRN

## 2022-06-27 NOTE — Sedation Documentation (Signed)
Vital signs stable. 

## 2022-06-27 NOTE — Sedation Documentation (Signed)
Patient is resting comfortably. 

## 2022-06-27 NOTE — Progress Notes (Signed)
Handoff to Barrington Ellison RN

## 2022-06-27 NOTE — Progress Notes (Addendum)
Patient and husbands was given discharge instructions. Both verbalized understanding.

## 2022-06-27 NOTE — Brief Op Note (Signed)
  NEUROSURGERY BRIEF OPERATIVE  NOTE   PREOP DX: Acom aneurysm  POSTOP DX: Same  PROCEDURE: Diagnostic cerebral angiogram  SURGEON: Dr. [name], MD  ANESTHESIA: IV Sedation with Local  APPROACH: Right trans-radial  EBL: Minimal  SPECIMENS: None  COMPLICATIONS: None  CONDITION: Stable to recovery  FINDINGS (Full report in [facility]): 1. Complete occlusion of previously seen anterior communicating artery aneurysm 1 year after clip ligation.   [name], MD [facility]

## 2022-06-27 NOTE — H&P (Signed)
Chief Complaint   Aneurysm  History of Present Illness  Bethany Lowery is a 51 y.o. female with a history of anterior communicating artery aneurysm discovered incidentally.  She underwent right pterional craniotomy for clipping of the aneurysm approximately 1 year ago.  She has done very well postoperatively.  She comes in today for routine follow-up angiography.  She has no new complaints.  She does have some mild occasional headaches.  She continues to complain of some numbness in the right thigh from the initial transfemoral angiogram.  Past Medical History   Past Medical History:  Diagnosis Date   Anxiety    Depression    Heart murmur    Hypertension    PONV (postoperative nausea and vomiting)     Past Surgical History   Past Surgical History:  Procedure Laterality Date   BACK SURGERY     BREAST SURGERY     CRANIOTOMY Right 06/30/2021   Procedure: PTERIONAL CRANIOTOMY FOR CLIPPING OF ANTERIOR COMMUNICATING ARTERY ANEURYSM;  Surgeon: [name], MD;  Location: [facility] OR;  Service: Neurosurgery;  Laterality: Right;   EYE SURGERY     IR ANGIO INTRA EXTRACRAN SEL INTERNAL CAROTID BILAT MOD SED  05/24/2021   IR ANGIO VERTEBRAL SEL VERTEBRAL BILAT MOD SED  05/24/2021    Social History   Social History   Tobacco Use   Smoking status: Never   Smokeless tobacco: Never  Vaping Use   Vaping Use: Never used  Substance Use Topics   Alcohol use: Not Currently   Drug use: Not Currently    Medications   Prior to Admission medications   Medication Sig Start Date End Date Taking? Authorizing Provider  amLODipine (NORVASC) 10 MG tablet Take 10 mg by mouth daily.   Yes [provider]  BIOTIN PO Take 1 tablet by mouth daily.   Yes [provider]  cetirizine (ZYRTEC) 10 MG tablet Take 10 mg by mouth daily as needed for allergies.   Yes [provider]  cholecalciferol (VITAMIN D3) 25 MCG (1000 UNIT) tablet Take 1,000 Units by mouth daily.   Yes  [provider]  Multiple Vitamin (MULTIVITAMIN WITH MINERALS) TABS tablet Take 1 tablet by mouth daily.   Yes [provider]  Omega-3 Fatty Acids (FISH OIL PO) Take 1 capsule by mouth daily.   Yes [provider]  venlafaxine (EFFEXOR) 25 MG tablet Take 12.5 mg by mouth 3 (three) times a week.   Yes [provider]  levETIRAcetam (KEPPRA) 500 MG tablet Take 1 tablet (500 mg total) by mouth every 12 (twelve) hours. 07/05/21   [name], MD  metoprolol tartrate (LOPRESSOR) 50 MG tablet Take 1 tablet (50 mg total) by mouth once for 1 dose. PLEASE TAKE METOPROLOL 2  HOURS PRIOR TO CTA SCAN. 03/16/21 03/16/21  Parke Poisson, MD  oxyCODONE-acetaminophen (PERCOCET/ROXICET) 5-325 MG tablet Take 1 tablet by mouth every 4 (four) hours as needed for severe pain. 07/05/21   [name], MD  vitamin E 180 MG (400 UNITS) capsule Take 400 Units by mouth daily.    [provider]    Allergies   Allergies  Allergen Reactions   Other Nausea And Vomiting    Pt has a hard time waking up after anesthesia, prefers light medication or dose   Penicillins Hives   Tramadol Hives    Allergic to brand Ultram   Zofran [Ondansetron] Hives    Review of Systems  ROS  Neurologic Exam  Awake, alert, oriented Memory  and concentration grossly intact Speech fluent, appropriate CN grossly intact Motor exam: Upper Extremities Deltoid Bicep Tricep Grip  Right 5/5 5/5 5/5 5/5  Left 5/5 5/5 5/5 5/5   Lower Extremities IP Quad PF DF EHL  Right 5/5 5/5 5/5 5/5 5/5  Left 5/5 5/5 5/5 5/5 5/5   Sensation grossly intact to LT  Impression  - 51 y.o. female 1 year status post elective right pterional clipping of anterior communicating artery aneurysm.  She is doing well.  Plan  -We will plan on proceeding with routine long-term follow-up angiogram, likely via transradial approach.  I have reviewed the details of the procedure as well as the expected  postoperative course and recovery at length with the patient in the office.  We have discussed the associated risks, benefits, and alternatives to surgery.  All her questions today were answered.  She provided informed consent to proceed.   [name], MD [facility]

## 2022-06-27 NOTE — Sedation Documentation (Signed)
Pt tolerated procedure well.   Totals: Time 18 mins Fentanyl 25 mcg Heparin 3000 units IV

## 2022-06-27 NOTE — Sedation Documentation (Signed)
Report given to Maureen Ralphs, RN SS

## 2022-06-27 NOTE — Sedation Documentation (Signed)
Patient is resting comfortably. No complaints at this time, procedure continues. VSS

## 2022-10-07 IMAGING — XA IR ANGIO VETEBRAL SEL VERTEBRAL BILAT MOD SED
11 of 12 series · 12 of 24 positions shown · IV contrast (IODINE)
Comparison: none

PROCEDURE:
DIAGNOSTIC CEREBRAL ANGIOGRAM
HISTORY: The patient is a 50-year-old woman with incidentally discovered
small anterior communicating artery aneurysm. She presents today for
further workup with diagnostic cerebral angiogram.
TECHNIQUE: CATHETERS AND WIRES
5-French JB-1 catheter

[Series 1: cerebral care 2 · 4 acquisitions, 2 frames shown (1 of 9)]
[im 1/4]
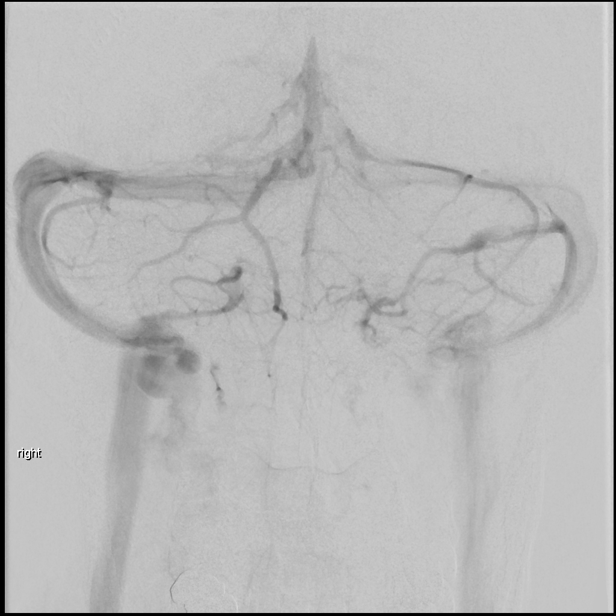
[im 4/4]
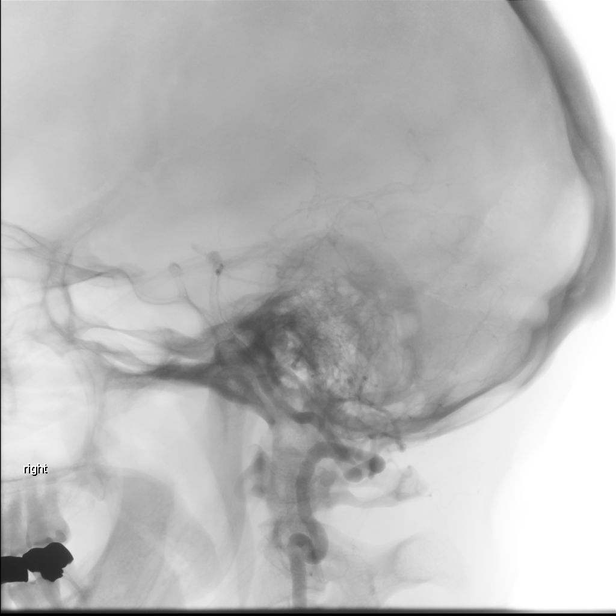

[Series 2: cerebral care 2 · 4 acquisitions, 1 frame shown (2 of 9)]
[im 2/4]
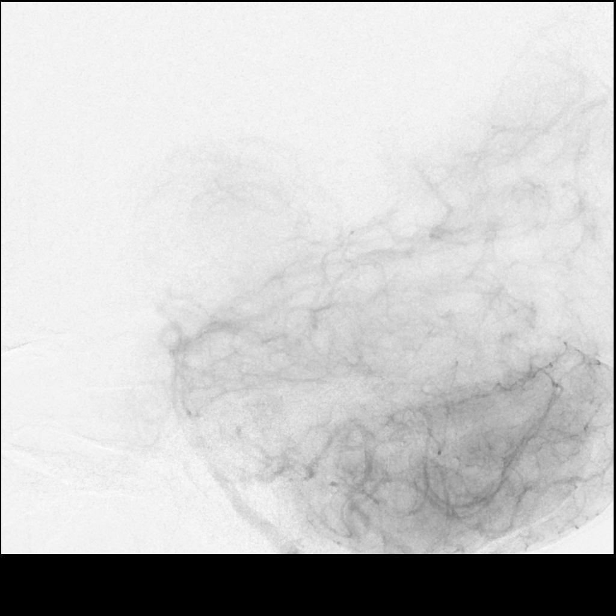

[Series 3: cerebral care 2 · 4 acquisitions, 1 frame shown (3 of 9)]
[im 1/4]
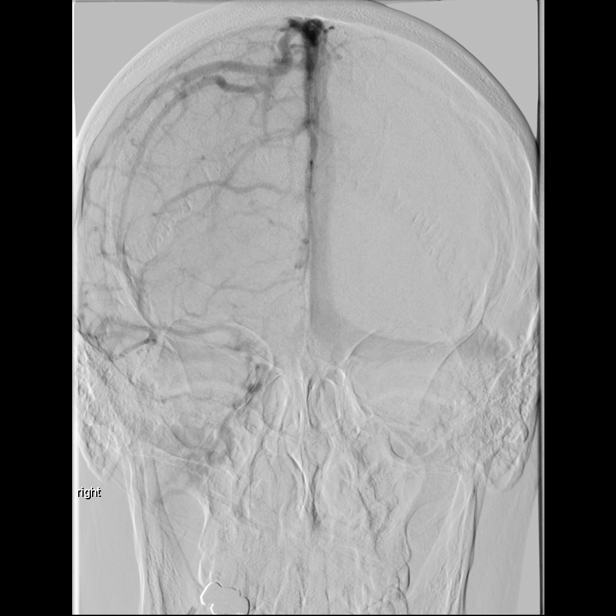

[Series 4: cerebral care 2 · 4 acquisitions, 1 frame shown (4 of 9)]
[im 1/4]
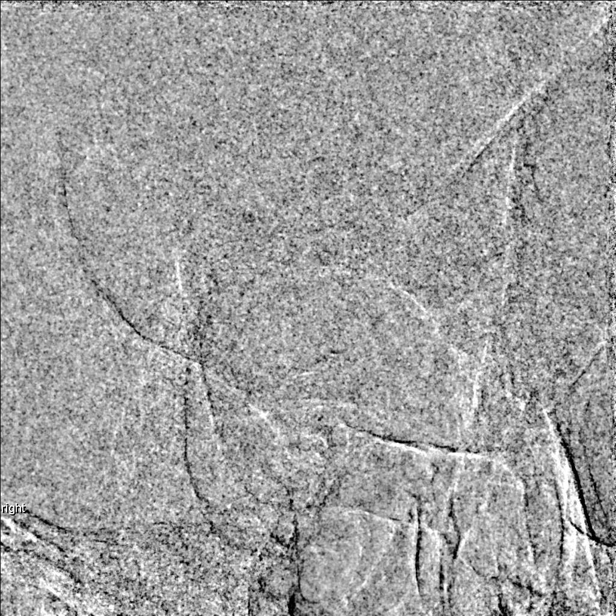

[Series 5: cerebral care 2 · 4 acquisitions, 1 frame shown (5 of 9)]
[im 1/4]
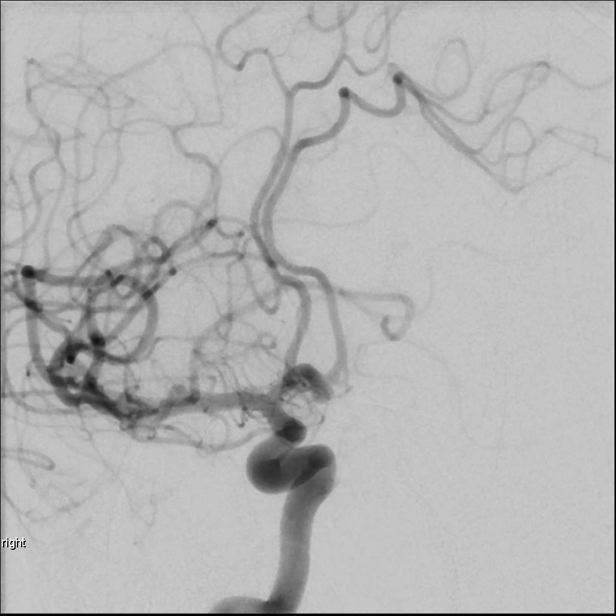

[Series 6: cerebral care 2 · 2 acquisitions, 1 frame shown (6 of 9)]
[im 1/2]
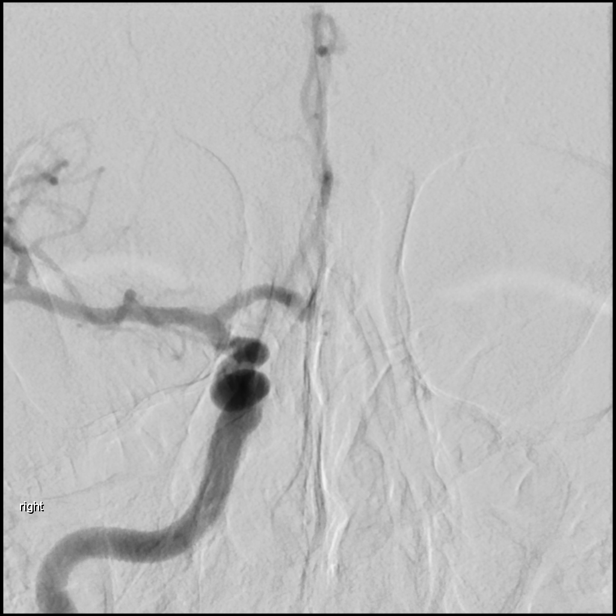

[Series 8: cerebral care 2 · 4 acquisitions, 1 frame shown (7 of 9)]
[im 1/4]
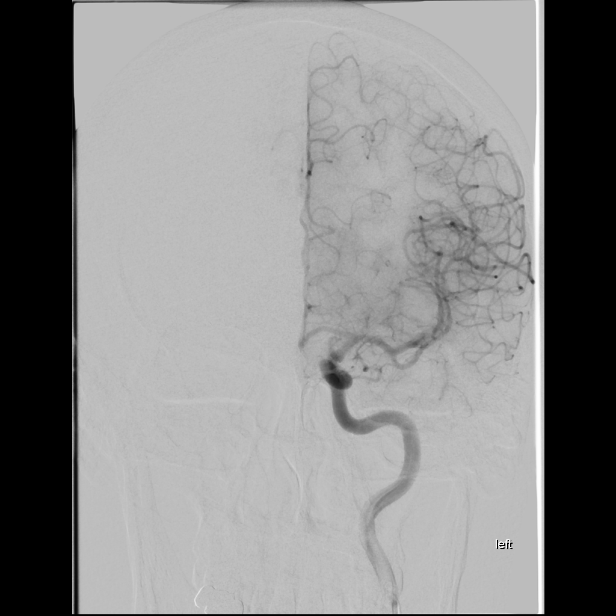

[Series 9: cerebral care 2 · 4 acquisitions, 1 frame shown (8 of 9)]
[im 1/4]
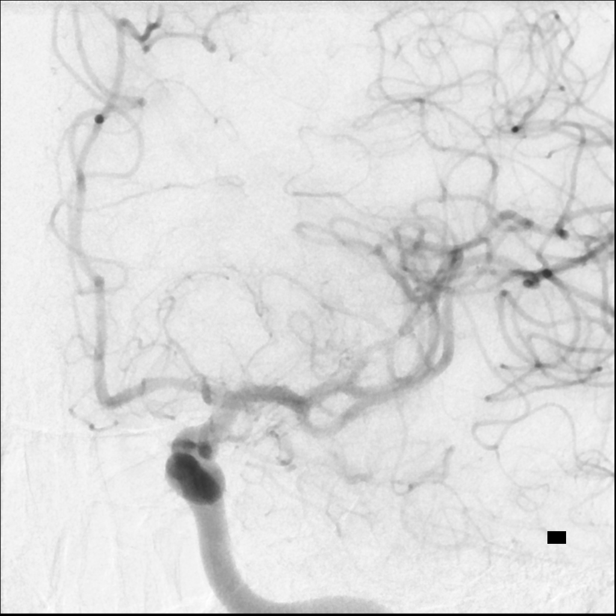

[Series 10: cerebral care 2 · 4 acquisitions, 1 frame shown (9 of 9)]
[im 1/4]
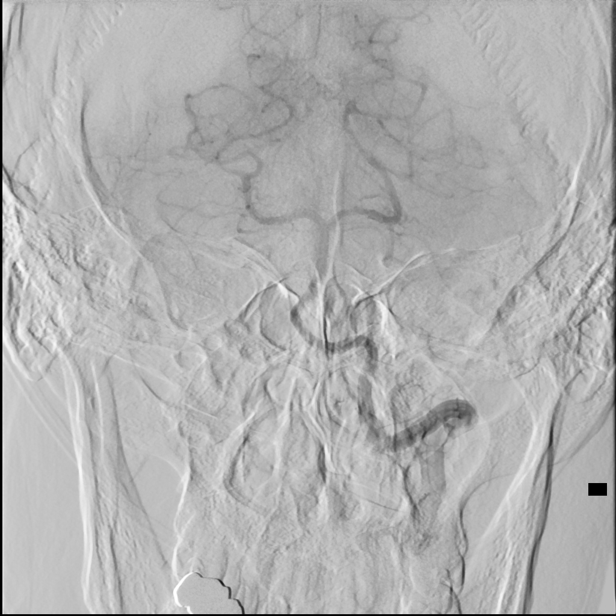

[Series 11: 5sdr head · 1 of 133 frames shown]
[frame 67/133]
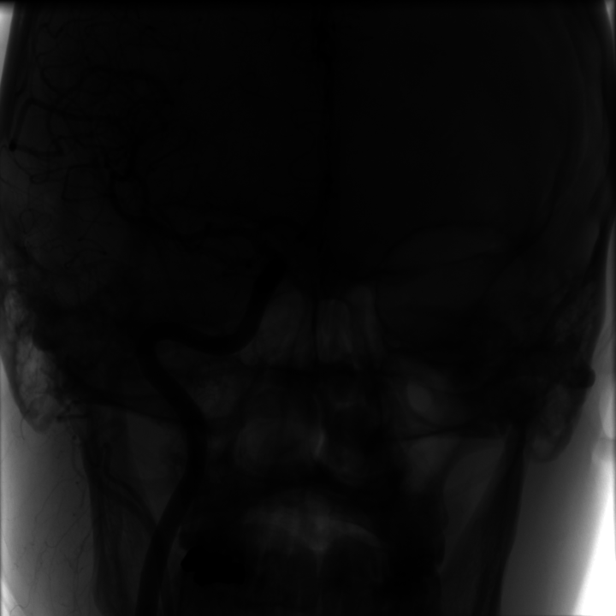

[Series 12: neuro care · 1 of 4 slices shown]
[im 4/4]
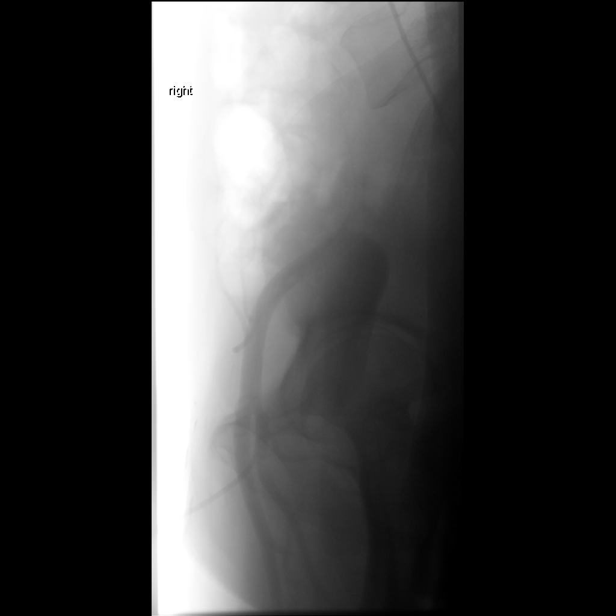

[12 of 24 positions shown; findings below may reference images not displayed]

ACCESS:
The technical aspects of the procedure as well as its potential
risks and benefits were reviewed with the patient. These risks
included but were not limited bleeding, infection, allergic
reaction, damage to organs or vital structures, stroke,
non-diagnostic procedure, and the catastrophic outcomes of heart
attack, coma, and death. With an understanding of these risks,
informed consent was obtained and witnessed. The patient was placed
in the supine position on the angiography table and the skin of
right groin prepped in the usual sterile fashion.

The procedure was performed under local anesthesia (1%-solution of
bicarbonate-buffered Lidocaine) and conscious sedation with 1mg
versed and 88micrograms fentanyl monitored by myself and the
in-suite nurse using continuous pulse-oximetry, heart rate, and
non-invasive blood-pressure.

A 5- French sheath was introduced in the right common femoral artery
using Seldinger technique. A fluoro-phase sequence was used to
document the sheath position.

MEDICATIONS:
HEPARIN: 4111 Units total.

CONTRAST:  cc, Omnipaque 300

FLUOROSCOPY TIME:  FLUOROSCOPY TIME: See IR records
0.035" glidewire

VESSELS CATHETERIZED
Right internal carotid

Left internal carotid

Left vertebral

Right vertebral

Right common femoral

VESSELS STUDIED
Right internal carotid, head

Right internal carotid, three-dimensional rotational angiogram

Left internal carotid, head

Left vertebral

Right vertebral

Right femoral

PROCEDURAL NARRATIVE
A 5-Fr JB-1 catheter was advanced over a 0.035 glidewire into the
aortic arch. The above vessels were then sequentially catheterized
and cervical / cerebral angiograms taken. After review of images,
the catheter was removed without incident.
FINDINGS: Right internal carotid, head:

Injection reveals the presence of a widely patent ICA, M1, and A1
segments and their branches. There is a small aneurysm projecting
anteriorly and inferiorly from the A1-A2 junction, better delineated
on the three-dimensional rotational angiogram. The parenchymal and
venous phases are normal. The venous sinuses are widely patent.

Right internal carotid, 3D rotation

3-dimensional rotational angiographic images were reconstructed on
[facility] workstation for review. These further delineate the
above described right A1 A2 junction aneurysm. The aneurysm measures
approximately 3 mm wide including the neck, an approximately 2.3 mm
tall. The aneurysm arises at the bifurcation of the right A1 into 2
A2 segments.

Left internal carotid, head:

Injection reveals the presence of a widely patent ICA, A1, and M1
segments and their branches. No aneurysms, AVMs, or high-flow
fistulas are seen. The parenchymal and venous phases are normal. The
venous sinuses are widely patent.

Left vertebral:

Injection reveals the presence of a widely patent vertebral artery.
This leads to a widely patent basilar artery that terminates in
bilateral P1. The basilar apex is normal. * The parenchymal and
venous phases are normal. The venous sinuses are widely patent.

Right vertebral:

The vertebral artery is widely patent. No PICA aneurysm is seen
however incidental note is made of extradural origin. See basilar
description above.

Right femoral:

Normal vessel. No significant atherosclerotic disease. Arterial
sheath in adequate position, with puncture site in the proximal
profundus femoris.

DISPOSITION:
Upon completion of the study, the femoral sheath was removed and
hemostasis obtained by manual compression. Good proximal and distal
lower extremity pulses were documented upon achievement of
hemostasis. The procedure was well tolerated and no early
complications were observed. The patient was transferred to the
holding area to lay flat for 2 hours.
IMPRESSION: 1. Small wide necked right A1 A2 junction aneurysm, as described
above.

The preliminary results of this procedure were shared with the
patient and the patient's family.
